# Patient Record
Sex: Female | Born: 1955
Health system: Southern US, Community
[De-identification: ages and names within clinical notes are randomized; demographics above are authoritative.]

## PROBLEM LIST (undated history)

## (undated) DIAGNOSIS — R55 Syncope and collapse: Secondary | ICD-10-CM

## (undated) DIAGNOSIS — N390 Urinary tract infection, site not specified: Secondary | ICD-10-CM

## (undated) DIAGNOSIS — T7840XA Allergy, unspecified, initial encounter: Secondary | ICD-10-CM

## (undated) DIAGNOSIS — Z86010 Personal history of colonic polyps: Secondary | ICD-10-CM

## (undated) DIAGNOSIS — J309 Allergic rhinitis, unspecified: Secondary | ICD-10-CM

## (undated) DIAGNOSIS — J302 Other seasonal allergic rhinitis: Secondary | ICD-10-CM

## (undated) DIAGNOSIS — M810 Age-related osteoporosis without current pathological fracture: Secondary | ICD-10-CM

## (undated) HISTORY — DX: Age-related osteoporosis without current pathological fracture: M81.0

## (undated) HISTORY — DX: Other seasonal allergic rhinitis: J30.2

## (undated) HISTORY — DX: Syncope and collapse: R55

## (undated) HISTORY — DX: Allergic rhinitis, unspecified: J30.9

## (undated) HISTORY — PX: COLONOSCOPY: SHX174

## (undated) HISTORY — PX: VARICOSE VEIN SURGERY: SHX832

## (undated) HISTORY — DX: Personal history of colonic polyps: Z86.010

## (undated) HISTORY — DX: Urinary tract infection, site not specified: N39.0

## (undated) HISTORY — DX: Allergy, unspecified, initial encounter: T78.40XA

## (undated) HISTORY — PX: TONSILLECTOMY: SUR1361

## (undated) HISTORY — PX: TMJ ARTHROPLASTY: SHX1066

---

## 2009-06-22 ENCOUNTER — Encounter: Payer: Self-pay | Admitting: Family Medicine

## 2009-10-03 ENCOUNTER — Ambulatory Visit: Payer: Self-pay | Admitting: Family Medicine

## 2009-10-03 DIAGNOSIS — J309 Allergic rhinitis, unspecified: Secondary | ICD-10-CM

## 2009-10-03 HISTORY — DX: Allergic rhinitis, unspecified: J30.9

## 2011-01-02 NOTE — Letter (Signed)
Summary: Records from [facility]   Records from [facility]   Imported By: Maryln Gottron 12/28/2009 12:19:32  _____________________________________________________________________  External Attachment:    Type:   Image     Comment:   External Document

## 2011-09-27 ENCOUNTER — Other Ambulatory Visit: Payer: Self-pay | Admitting: Family Medicine

## 2011-09-27 DIAGNOSIS — Z1231 Encounter for screening mammogram for malignant neoplasm of breast: Secondary | ICD-10-CM

## 2011-10-24 ENCOUNTER — Ambulatory Visit (HOSPITAL_COMMUNITY)
Admission: RE | Admit: 2011-10-24 | Discharge: 2011-10-24 | Disposition: A | Discharge status: Home / Self Care | Payer: 59 | Source: Ambulatory Visit | Attending: Family Medicine | Admitting: Family Medicine

## 2011-10-24 DIAGNOSIS — Z1231 Encounter for screening mammogram for malignant neoplasm of breast: Secondary | ICD-10-CM

## 2012-01-24 ENCOUNTER — Telehealth: Payer: Self-pay | Admitting: Family Medicine

## 2012-01-24 ENCOUNTER — Ambulatory Visit (INDEPENDENT_AMBULATORY_CARE_PROVIDER_SITE_OTHER): Payer: 59 | Admitting: Family Medicine

## 2012-01-24 ENCOUNTER — Encounter: Payer: Self-pay | Admitting: Family Medicine

## 2012-01-24 DIAGNOSIS — J309 Allergic rhinitis, unspecified: Secondary | ICD-10-CM

## 2012-01-24 DIAGNOSIS — J069 Acute upper respiratory infection, unspecified: Secondary | ICD-10-CM

## 2012-01-24 MED ORDER — FLUTICASONE PROPIONATE 50 MCG/ACT NA SUSP: 2.0000 | Freq: Every day | NASAL | Status: DC

## 2012-01-24 NOTE — Progress Notes (Signed)
  Subjective:    Patient ID: Rebecca Ray, female    DOB: 1956-04-04, 56 y.o.   MRN: [id number]  HPI  Patient has history of perennial allergic rhinitis. Takes Zyrtec daily and Flonase frequently. Needs refills of Flonase. She also has recent cold symptoms that started Monday. Increase malaise and bodyaches. Clear nasal discharge. Rare cough. Minimal sore throat.  No side effects from Flonase.   Review of Systems  Constitutional: Positive for fatigue. Negative for fever and chills.  HENT: Positive for congestion and postnasal drip.   Respiratory: Negative for cough.        Objective:   Physical Exam  Constitutional: She appears well-developed and well-nourished.  HENT:  Right Ear: External ear normal.  Left Ear: External ear normal.  Mouth/Throat: Oropharynx is clear and moist.  Neck: Neck supple.  Cardiovascular: Normal rate and regular rhythm.   Pulmonary/Chest: Effort normal and breath sounds normal. No respiratory distress. She has no wheezes. She has no rales.  Lymphadenopathy:    She has no cervical adenopathy.          Assessment & Plan:  #1 allergic rhinitis, perennial. Refill Flonase for one year #2 viral URI. Plenty of fluids. Flonase as above. Followup as needed

## 2012-01-24 NOTE — Telephone Encounter (Signed)
VM left for pt on home phone that we cannot fill this med for her with time lapse of 3 years.  Perhaps her OB/GYN can fill this for her, or she can schedule a re-establish appt.

## 2012-01-24 NOTE — Telephone Encounter (Signed)
Pt called req to get a refill of a script for Flonase nasal spray. Pt said that this med was prescribed to her by a Dr. In [place]. Pls call in to [facility] in [place]. Pls notify pt when this has been done. Pt last seen in 10/2009.

## 2014-06-16 ENCOUNTER — Other Ambulatory Visit (INDEPENDENT_AMBULATORY_CARE_PROVIDER_SITE_OTHER): Payer: 59

## 2014-06-16 DIAGNOSIS — Z0000 Encounter for general adult medical examination without abnormal findings: Secondary | ICD-10-CM

## 2014-06-16 LAB — LIPID PANEL
CHOL/HDL RATIO: 4
Cholesterol: 316 mg/dL — ABNORMAL HIGH (ref 0–200)
HDL: 86.7 mg/dL (ref >39.00)
LDL Cholesterol: 217 mg/dL — ABNORMAL HIGH (ref 0–99)
NONHDL: 229.3
TRIGLYCERIDES: 63 mg/dL (ref 0.0–149.0)
VLDL: 12.6 mg/dL (ref 0.0–40.0)

## 2014-06-16 LAB — POCT URINALYSIS DIPSTICK
BILIRUBIN UA: NEGATIVE
GLUCOSE UA: NEGATIVE
Ketones, UA: NEGATIVE
LEUKOCYTES UA: NEGATIVE
Nitrite, UA: NEGATIVE
Protein, UA: NEGATIVE
RBC UA: NEGATIVE
Spec Grav, UA: 1.015
UROBILINOGEN UA: 0.2
pH, UA: 7.5

## 2014-06-16 LAB — BASIC METABOLIC PANEL
BUN: 13 mg/dL (ref 6–23)
CALCIUM: 10.1 mg/dL (ref 8.4–10.5)
CO2: 30 mEq/L (ref 19–32)
CREATININE: 0.7 mg/dL (ref 0.4–1.2)
Chloride: 100 mEq/L (ref 96–112)
GFR: 99.57 mL/min (ref >60.00)
Glucose, Bld: 99 mg/dL (ref 70–99)
Potassium: 4.1 mEq/L (ref 3.5–5.1)
Sodium: 139 mEq/L (ref 135–145)

## 2014-06-16 LAB — CBC WITH DIFFERENTIAL/PLATELET
BASOS PCT: 0.4 % (ref 0.0–3.0)
Basophils Absolute: 0 10*3/uL (ref 0.0–0.1)
EOS ABS: 0 10*3/uL (ref 0.0–0.7)
EOS PCT: 1.2 % (ref 0.0–5.0)
HCT: 43.2 % (ref 36.0–46.0)
Hemoglobin: 14.4 g/dL (ref 12.0–15.0)
LYMPHS PCT: 46.3 % — AB (ref 12.0–46.0)
Lymphs Abs: 1.8 10*3/uL (ref 0.7–4.0)
MCHC: 33.5 g/dL (ref 30.0–36.0)
MCV: 95.6 fl (ref 78.0–100.0)
MONOS PCT: 9 % (ref 3.0–12.0)
Monocytes Absolute: 0.3 10*3/uL (ref 0.1–1.0)
NEUTROS PCT: 43.1 % (ref 43.0–77.0)
Neutro Abs: 1.7 10*3/uL (ref 1.4–7.7)
Platelets: 236 10*3/uL (ref 150.0–400.0)
RBC: 4.52 Mil/uL (ref 3.87–5.11)
RDW: 13.5 % (ref 11.5–15.5)
WBC: 3.9 10*3/uL — AB (ref 4.0–10.5)

## 2014-06-16 LAB — HEPATIC FUNCTION PANEL
ALK PHOS: 62 U/L (ref 39–117)
ALT: 16 U/L (ref 0–35)
AST: 20 U/L (ref 0–37)
Albumin: 4.6 g/dL (ref 3.5–5.2)
BILIRUBIN DIRECT: 0 mg/dL (ref 0.0–0.3)
TOTAL PROTEIN: 7.5 g/dL (ref 6.0–8.3)
Total Bilirubin: 0.6 mg/dL (ref 0.2–1.2)

## 2014-06-16 LAB — TSH: TSH: 1.84 u[IU]/mL (ref 0.35–4.50)

## 2014-06-25 ENCOUNTER — Encounter: Payer: Self-pay | Admitting: Family Medicine

## 2014-06-25 ENCOUNTER — Ambulatory Visit (INDEPENDENT_AMBULATORY_CARE_PROVIDER_SITE_OTHER): Payer: 59 | Admitting: Family Medicine

## 2014-06-25 VITALS — BP 110/72 | HR 85 | Temp 98.2°F | Wt 144.0 lb

## 2014-06-25 DIAGNOSIS — Z0000 Encounter for general adult medical examination without abnormal findings: Secondary | ICD-10-CM

## 2014-06-25 NOTE — Progress Notes (Signed)
Pre visit review using our clinic review tool, if applicable. No additional management support is needed unless otherwise documented below in the visit note. 

## 2014-06-25 NOTE — Progress Notes (Signed)
   Subjective:    Patient ID: Rebecca Ray, female    DOB: August 27, 1956, 58 y.o.   MRN: [id number]  HPI Patient seen for complete physical. She sees gynecologist regularly. She's never had screening colonoscopy. Tetanus up-to-date. She exercises regularly. No family history of premature CAD. She has never smoked.  Reviewed:  Past Medical History  Diagnosis Date  . ALLERGIC RHINITIS 10/03/2009  . UTI (urinary tract infection)    Past Surgical History  Procedure Laterality Date  . Tonsillectomy    . Varicose vein surgery    . Tmj arthroplasty      reports that she has never smoked. She does not have any smokeless tobacco history on file. Her alcohol and drug histories are not on file. family history includes Arthritis in her other; Hyperlipidemia in her other; Stroke in her other. No Known Allergies    Review of Systems  Constitutional: Negative for fever, activity change, appetite change, fatigue and unexpected weight change.  HENT: Negative for ear pain, hearing loss, sore throat and trouble swallowing.   Eyes: Negative for visual disturbance.  Respiratory: Negative for cough and shortness of breath.   Cardiovascular: Negative for chest pain and palpitations.  Gastrointestinal: Negative for abdominal pain, diarrhea, constipation and blood in stool.  Endocrine: Negative for polydipsia and polyuria.  Genitourinary: Negative for dysuria and hematuria.  Musculoskeletal: Negative for arthralgias, back pain and myalgias.  Skin: Negative for rash.  Neurological: Negative for dizziness, syncope and headaches.  Hematological: Negative for adenopathy.  Psychiatric/Behavioral: Negative for confusion and dysphoric mood.       Objective:   Physical Exam  Constitutional: She is oriented to person, place, and time. She appears well-developed and well-nourished.  HENT:  Head: Normocephalic and atraumatic.  Eyes: EOM are normal. Pupils are equal, round, and reactive to light.  Neck:  Normal range of motion. Neck supple. No thyromegaly present.  Cardiovascular: Normal rate, regular rhythm and normal heart sounds.   No murmur heard. Pulmonary/Chest: Breath sounds normal. No respiratory distress. She has no wheezes. She has no rales.  Abdominal: Soft. Bowel sounds are normal. She exhibits no distension and no mass. There is no tenderness. There is no rebound and no guarding.  Musculoskeletal: Normal range of motion. She exhibits no edema.  Lymphadenopathy:    She has no cervical adenopathy.  Neurological: She is alert and oriented to person, place, and time. She displays normal reflexes. No cranial nerve deficit.  Skin: No rash noted.  Psychiatric: She has a normal mood and affect. Her behavior is normal. Judgment and thought content normal.          Assessment & Plan:  Complete physical. Labs reviewed. She has high cholesterol but excellent HDL. Only 2% CHD risk over 10 years. She's not interested in statin therapy. Set up colonoscopy. Continue GYN followup

## 2014-06-30 ENCOUNTER — Encounter: Payer: Self-pay | Admitting: Internal Medicine

## 2014-08-25 ENCOUNTER — Ambulatory Visit (AMBULATORY_SURGERY_CENTER): Payer: Self-pay

## 2014-08-25 VITALS — Ht 67.0 in | Wt 141.4 lb

## 2014-08-25 DIAGNOSIS — Z1211 Encounter for screening for malignant neoplasm of colon: Secondary | ICD-10-CM

## 2014-08-25 NOTE — Progress Notes (Signed)
No allergie to eggs or soy No home oxygen No past problems with anesthesia-difficulty waking up and diaphoretic in [place] for varicose vein surgery No diet/weight loss meds  Has email  Emmi instructions given for colonoscopy

## 2014-09-02 ENCOUNTER — Encounter: Payer: Self-pay | Admitting: Internal Medicine

## 2014-09-07 ENCOUNTER — Ambulatory Visit (AMBULATORY_SURGERY_CENTER): Payer: 59 | Admitting: Internal Medicine

## 2014-09-07 ENCOUNTER — Encounter: Payer: Self-pay | Admitting: Internal Medicine

## 2014-09-07 VITALS — BP 115/71 | HR 75 | Temp 96.5°F | Resp 23 | Ht 67.0 in | Wt 141.0 lb

## 2014-09-07 DIAGNOSIS — Z860100 Personal history of colon polyps, unspecified: Secondary | ICD-10-CM

## 2014-09-07 DIAGNOSIS — D123 Benign neoplasm of transverse colon: Secondary | ICD-10-CM

## 2014-09-07 DIAGNOSIS — Z1211 Encounter for screening for malignant neoplasm of colon: Secondary | ICD-10-CM

## 2014-09-07 HISTORY — DX: Personal history of colonic polyps: Z86.010

## 2014-09-07 HISTORY — DX: Personal history of colon polyps, unspecified: Z86.0100

## 2014-09-07 MED ORDER — SODIUM CHLORIDE 0.9 % IV SOLN: 500.0000 mL | INTRAVENOUS | Status: DC

## 2014-09-07 NOTE — Op Note (Addendum)
[place]  [address] [place], [id number]   COLONOSCOPY PROCEDURE REPORT  PATIENT: Rebecca Ray, Rebecca Ray  MR#: [id number] BIRTHDATE: [birth year] , 73  yrs. old GENDER: female ENDOSCOPIST: Gatha Mayer, MD, [facility] PROCEDURE DATE:  09/07/2014 PROCEDURE:   Colonoscopy with snare polypectomy First Screening Colonoscopy - Avg.  risk and is 50 yrs.  old or older Yes.  Prior Negative Screening - Now for repeat screening. N/A  History of Adenoma - Now for follow-up colonoscopy & has been > or = to 3 yrs.  N/A ASA CLASS:   Class I INDICATIONS:first colonoscopy and average risk for colon cancer. MEDICATIONS: Propofol 400 mg IV and Monitored anesthesia care  DESCRIPTION OF PROCEDURE:   After the risks benefits and alternatives of the procedure were thoroughly explained, informed consent was obtained.  The digital rectal exam revealed no abnormalities of the rectum.   The LB [id number]  endoscope was introduced through the anus and advanced to the cecum, which was identified by both the appendix and ileocecal valve. No adverse events experienced.   The quality of the prep was excellent, using MiraLax  The instrument was then slowly withdrawn as the colon was fully examined.      COLON FINDINGS: A semi-pedunculated polyp measuring 12 mm in size was found in the transverse colon.  A polypectomy was performed using snare cautery.  The resection was complete, the polyp tissue was completely retrieved and sent to histology.   The examination was otherwise normal.   Right colon retroflexion included. Retroflexed views revealed no abnormalities. The time to cecum=6 minutes 42 seconds.  Withdrawal time=10 minutes 04 seconds.  The scope was withdrawn and the procedure completed. COMPLICATIONS: There were no immediate complications.  ENDOSCOPIC IMPRESSION: 1.   Semi-pedunculated polyp was found in the transverse colon; polypectomy was performed using snare cautery 2.   The  examination was otherwise normal - excellent prep - first screening  RECOMMENDATIONS: 1.  Timing of repeat colonoscopy will be determined by pathology findings. 2.  Hold Aspirin and all other NSAIDS for 2 weeks.  eSigned:  Gatha Mayer, MD, [facility] 09/07/2014 11:51 AM Revised: 09/07/2014 11:51 AM  cc: Carolann Littler, MD and The Patient

## 2014-09-07 NOTE — Progress Notes (Signed)
Called to room to assist during endoscopic procedure.  Patient ID and intended procedure confirmed with present staff. Received instructions for my participation in the procedure from the performing physician.  

## 2014-09-07 NOTE — Patient Instructions (Addendum)
I found and removed one polyp that looks benign.  I will let you know pathology results and when to have another routine colonoscopy by mail.  I appreciate the opportunity to care for you. Gatha Mayer, MD, FACG  YOU HAD AN ENDOSCOPIC PROCEDURE TODAY AT [facility]: Refer to the procedure report that was given to you for any specific questions about what was found during the examination.  If the procedure report does not answer your questions, please call your gastroenterologist to clarify.  If you requested that your care partner not be given the details of your procedure findings, then the procedure report has been included in a sealed envelope for you to review at your convenience later.  YOU SHOULD EXPECT: Some feelings of bloating in the abdomen. Passage of more gas than usual.  Walking can help get rid of the air that was put into your GI tract during the procedure and reduce the bloating. If you had a lower endoscopy (such as a colonoscopy or flexible sigmoidoscopy) you may notice spotting of blood in your stool or on the toilet paper. If you underwent a bowel prep for your procedure, then you may not have a normal bowel movement for a few days.  DIET: Your first meal following the procedure should be a light meal and then it is ok to progress to your normal diet.  A half-sandwich or bowl of soup is an example of a good first meal.  Heavy or fried foods are harder to digest and may make you feel nauseous or bloated.  Likewise meals heavy in dairy and vegetables can cause extra gas to form and this can also increase the bloating.  Drink plenty of fluids but you should avoid alcoholic beverages for 24 hours.  ACTIVITY: Your care partner should take you home directly after the procedure.  You should plan to take it easy, moving slowly for the rest of the day.  You can resume normal activity the day after the procedure however you should NOT DRIVE or use heavy machinery  for 24 hours (because of the sedation medicines used during the test).    SYMPTOMS TO REPORT IMMEDIATELY: A gastroenterologist can be reached at any hour.  During normal business hours, 8:30 AM to 5:00 PM Monday through Friday, call [phone].  After hours and on weekends, please call the GI answering service at [phone] who will take a message and have the physician on call contact you.   Following lower endoscopy (colonoscopy or flexible sigmoidoscopy):  Excessive amounts of blood in the stool  Significant tenderness or worsening of abdominal pains  Swelling of the abdomen that is new, acute  Fever of 100F or higher  FOLLOW UP: If any biopsies were taken you will be contacted by phone or by letter within the next 1-3 weeks.  Call your gastroenterologist if you have not heard about the biopsies in 3 weeks.  Our staff will call the home number listed on your records the next business day following your procedure to check on you and address any questions or concerns that you may have at that time regarding the information given to you following your procedure. This is a courtesy call and so if there is no answer at the home number and we have not heard from you through the emergency physician on call, we will assume that you have returned to your regular daily activities without incident.  SIGNATURES/CONFIDENTIALITY: You and/or your care partner have  signed paperwork which will be entered into your electronic medical record.  These signatures attest to the fact that that the information above on your After Visit Summary has been reviewed and is understood.  Full responsibility of the confidentiality of this discharge information lies with you and/or your care-partner.  Recommendations Next  colonoscopy determined by pathology results. Polyp handout given to patient/caregiver. Advised patient to hold all aspirin, aspirin products, and NSAIDS for 2 weeks.

## 2014-09-07 NOTE — Progress Notes (Signed)
Procedure ends, to recovery, report given and VSS. 

## 2014-09-08 ENCOUNTER — Telehealth: Payer: Self-pay

## 2014-09-08 NOTE — Telephone Encounter (Signed)
  Follow up Call-  Call back number 09/07/2014  Post procedure Call Back phone  # [phone]  Permission to leave phone message Yes     Patient questions:  Do you have a fever, pain , or abdominal swelling? No. Pain Score  0 *  Have you tolerated food without any problems? Yes.    Have you been able to return to your normal activities? Yes.    Do you have any questions about your discharge instructions: Diet   No. Medications  No. Follow up visit  No.  Do you have questions or concerns about your Care? No.  Actions: * If pain score is 4 or above: No action needed, pain <4.  No problems per the pt. maw

## 2014-09-15 ENCOUNTER — Encounter: Payer: Self-pay | Admitting: Internal Medicine

## 2014-09-15 NOTE — Progress Notes (Signed)
Quick Note:  12 mmadenoma - repeat colonoscopy 2018 ______

## 2015-04-05 ENCOUNTER — Other Ambulatory Visit: Payer: Self-pay | Admitting: Obstetrics and Gynecology

## 2015-04-07 ENCOUNTER — Other Ambulatory Visit: Payer: Self-pay | Admitting: Obstetrics and Gynecology

## 2015-04-07 DIAGNOSIS — R928 Other abnormal and inconclusive findings on diagnostic imaging of breast: Secondary | ICD-10-CM

## 2015-04-07 LAB — CYTOLOGY - PAP

## 2015-04-13 ENCOUNTER — Ambulatory Visit
Admission: RE | Admit: 2015-04-13 | Discharge: 2015-04-13 | Disposition: A | Discharge status: Home / Self Care | Payer: 59 | Source: Ambulatory Visit | Attending: Obstetrics and Gynecology | Admitting: Obstetrics and Gynecology

## 2015-04-13 DIAGNOSIS — R928 Other abnormal and inconclusive findings on diagnostic imaging of breast: Secondary | ICD-10-CM

## 2015-04-13 LAB — HM MAMMOGRAPHY

## 2015-05-10 ENCOUNTER — Telehealth: Payer: Self-pay

## 2015-05-10 NOTE — Telephone Encounter (Signed)
Patient returned your call and states she had a mammogram last month at the [facility] center.  The notes are in [place].

## 2015-05-10 NOTE — Telephone Encounter (Signed)
Left mess, home voicemail to call us (concerning overdue mammogram)

## 2015-06-29 ENCOUNTER — Encounter: Payer: Self-pay | Admitting: *Deleted

## 2016-05-30 ENCOUNTER — Other Ambulatory Visit: Payer: Self-pay | Admitting: Obstetrics and Gynecology

## 2016-05-31 LAB — CYTOLOGY - PAP

## 2016-08-08 ENCOUNTER — Other Ambulatory Visit (INDEPENDENT_AMBULATORY_CARE_PROVIDER_SITE_OTHER): Payer: 59

## 2016-08-08 DIAGNOSIS — Z0000 Encounter for general adult medical examination without abnormal findings: Secondary | ICD-10-CM

## 2016-08-08 LAB — CBC WITH DIFFERENTIAL/PLATELET
BASOS PCT: 0.6 % (ref 0.0–3.0)
Basophils Absolute: 0 10*3/uL (ref 0.0–0.1)
EOS PCT: 1 % (ref 0.0–5.0)
Eosinophils Absolute: 0 10*3/uL (ref 0.0–0.7)
HCT: 42 % (ref 36.0–46.0)
Hemoglobin: 14.6 g/dL (ref 12.0–15.0)
LYMPHS ABS: 1.5 10*3/uL (ref 0.7–4.0)
Lymphocytes Relative: 40.5 % (ref 12.0–46.0)
MCHC: 34.7 g/dL (ref 30.0–36.0)
MCV: 92.6 fl (ref 78.0–100.0)
MONOS PCT: 11.3 % (ref 3.0–12.0)
Monocytes Absolute: 0.4 10*3/uL (ref 0.1–1.0)
NEUTROS ABS: 1.8 10*3/uL (ref 1.4–7.7)
NEUTROS PCT: 46.6 % (ref 43.0–77.0)
PLATELETS: 256 10*3/uL (ref 150.0–400.0)
RBC: 4.53 Mil/uL (ref 3.87–5.11)
RDW: 13.6 % (ref 11.5–15.5)
WBC: 3.8 10*3/uL — ABNORMAL LOW (ref 4.0–10.5)

## 2016-08-08 LAB — LIPID PANEL
CHOLESTEROL: 295 mg/dL — AB (ref 0–200)
HDL: 85.4 mg/dL (ref >39.00)
LDL Cholesterol: 192 mg/dL — ABNORMAL HIGH (ref 0–99)
NonHDL: 210.02
Total CHOL/HDL Ratio: 3
Triglycerides: 90 mg/dL (ref 0.0–149.0)
VLDL: 18 mg/dL (ref 0.0–40.0)

## 2016-08-08 LAB — TSH: TSH: 3.7 u[IU]/mL (ref 0.35–4.50)

## 2016-08-08 LAB — BASIC METABOLIC PANEL
BUN: 10 mg/dL (ref 6–23)
CALCIUM: 9.7 mg/dL (ref 8.4–10.5)
CO2: 32 meq/L (ref 19–32)
Chloride: 101 mEq/L (ref 96–112)
Creatinine, Ser: 0.64 mg/dL (ref 0.40–1.20)
GFR: 100.62 mL/min (ref >60.00)
GLUCOSE: 91 mg/dL (ref 70–99)
POTASSIUM: 4.4 meq/L (ref 3.5–5.1)
SODIUM: 140 meq/L (ref 135–145)

## 2016-08-08 LAB — HEPATIC FUNCTION PANEL
ALBUMIN: 4.6 g/dL (ref 3.5–5.2)
ALT: 16 U/L (ref 0–35)
AST: 19 U/L (ref 0–37)
Alkaline Phosphatase: 65 U/L (ref 39–117)
Bilirubin, Direct: 0.1 mg/dL (ref 0.0–0.3)
Total Bilirubin: 0.6 mg/dL (ref 0.2–1.2)
Total Protein: 6.9 g/dL (ref 6.0–8.3)

## 2016-08-13 ENCOUNTER — Encounter: Payer: Self-pay | Admitting: Family Medicine

## 2016-08-13 ENCOUNTER — Ambulatory Visit (INDEPENDENT_AMBULATORY_CARE_PROVIDER_SITE_OTHER): Payer: 59 | Admitting: Family Medicine

## 2016-08-13 VITALS — BP 110/80 | HR 96 | Temp 98.3°F | Ht 67.0 in | Wt 145.6 lb

## 2016-08-13 DIAGNOSIS — Z0000 Encounter for general adult medical examination without abnormal findings: Secondary | ICD-10-CM | POA: Diagnosis not present

## 2016-08-13 NOTE — Patient Instructions (Signed)

## 2016-08-13 NOTE — Progress Notes (Signed)
Pre visit review using our clinic review tool, if applicable. No additional management support is needed unless otherwise documented below in the visit note. 

## 2016-08-13 NOTE — Progress Notes (Signed)
Subjective:     Patient ID: Rebecca Ray, female   DOB: Nov 19, 1956, 60 y.o.   MRN: [id number]  HPI Patient here for physical exam. She sees gynecologist yearly and is getting regular mammograms and Pap smears through them. She has no chronic medical problems. Takes no regular medications. She does have prior history of colon polyps and will be scheduled for repeat colonoscopy next year. Tetanus up-to-date. She declines flu vaccination. Exercises regularly. Family history significant for mother and sister with history of fatty liver changes. Patient is nonsmoker. Drinks about 1 alcoholic beverage every other day  Past Medical History:  Diagnosis Date  . ALLERGIC RHINITIS 10/03/2009  . History of colonic polyp - adenoma 09/07/2014  . Seasonal allergies   . UTI (urinary tract infection)    Past Surgical History:  Procedure Laterality Date  . TMJ ARTHROPLASTY    . TONSILLECTOMY    . VARICOSE VEIN SURGERY      reports that she has never smoked. She has never used smokeless tobacco. She reports that she drinks about 1.8 oz of alcohol per week . She reports that she does not use drugs. family history includes Arthritis in her other; Atrial fibrillation in her father; Cirrhosis in her mother; Hyperlipidemia in her other; Stroke in her other. No Known Allergies   Review of Systems  Constitutional: Negative for activity change, appetite change, fatigue, fever and unexpected weight change.  HENT: Negative for ear pain, hearing loss, sore throat and trouble swallowing.   Eyes: Negative for visual disturbance.  Respiratory: Negative for cough and shortness of breath.   Cardiovascular: Negative for chest pain and palpitations.  Gastrointestinal: Negative for abdominal pain, blood in stool, constipation and diarrhea.  Endocrine: Negative for polydipsia and polyuria.  Genitourinary: Negative for dysuria and hematuria.  Musculoskeletal: Negative for arthralgias, back pain and myalgias.  Skin: Negative  for rash.  Neurological: Negative for dizziness, syncope, weakness and headaches.  Hematological: Negative for adenopathy. Does not bruise/bleed easily.  Psychiatric/Behavioral: Negative for confusion and dysphoric mood.       Objective:   Physical Exam  Constitutional: She is oriented to person, place, and time. She appears well-developed and well-nourished.  HENT:  Head: Normocephalic and atraumatic.  Eyes: EOM are normal. Pupils are equal, round, and reactive to light.  Neck: Normal range of motion. Neck supple. No thyromegaly present.  Cardiovascular: Normal rate, regular rhythm and normal heart sounds.   No murmur heard. Pulmonary/Chest: Breath sounds normal. No respiratory distress. She has no wheezes. She has no rales.  Abdominal: Soft. Bowel sounds are normal. She exhibits no distension and no mass. There is no tenderness. There is no rebound and no guarding.  Genitourinary:  Genitourinary Comments: Per GYN  Musculoskeletal: Normal range of motion. She exhibits no edema.  Lymphadenopathy:    She has no cervical adenopathy.  Neurological: She is alert and oriented to person, place, and time. She displays normal reflexes. No cranial nerve deficit.  Skin: No rash noted.  Psychiatric: She has a normal mood and affect. Her behavior is normal. Judgment and thought content normal.       Assessment:     Physical exam-patient declines flu vaccination. Labs reviewed. She has significant dyslipidemia but overall low risk for CAD    Plan:     -Flu vaccine offered and declined -Will need repeat colonoscopy by next year -We discussed prevention of fatty liver disease with continued weight control -Discussed dyslipidemia. She is not interested in statin therapy. We discussed lifestyle  management  Eulas Post MD [facility]

## 2017-06-07 LAB — HM MAMMOGRAPHY

## 2017-06-12 ENCOUNTER — Encounter: Payer: Self-pay | Admitting: Family Medicine

## 2017-09-25 ENCOUNTER — Encounter: Payer: Self-pay | Admitting: Internal Medicine

## 2017-10-01 ENCOUNTER — Ambulatory Visit (AMBULATORY_SURGERY_CENTER): Payer: Self-pay

## 2017-10-01 VITALS — Ht 67.0 in | Wt 144.0 lb

## 2017-10-01 DIAGNOSIS — Z86010 Personal history of colonic polyps: Secondary | ICD-10-CM

## 2017-10-01 NOTE — Progress Notes (Signed)
Per pt, no allergies to soy or egg products.Pt not taking any weight loss meds or using  O2 at home.   Pt refused Emmi video. 

## 2017-10-02 ENCOUNTER — Encounter: Payer: Self-pay | Admitting: Internal Medicine

## 2017-10-16 ENCOUNTER — Ambulatory Visit (AMBULATORY_SURGERY_CENTER): Payer: 59 | Admitting: Internal Medicine

## 2017-10-16 ENCOUNTER — Other Ambulatory Visit: Payer: Self-pay

## 2017-10-16 ENCOUNTER — Encounter: Payer: Self-pay | Admitting: Internal Medicine

## 2017-10-16 DIAGNOSIS — Z86010 Personal history of colonic polyps: Secondary | ICD-10-CM | POA: Diagnosis not present

## 2017-10-16 NOTE — Progress Notes (Signed)
A/ox3 pleased with MAC, report to [place]

## 2017-10-16 NOTE — Op Note (Signed)
[place] Patient Name: Rebecca Ray Procedure Date: 10/16/2017 3:10 PM MRN: [id number] Endoscopist: Gatha Mayer , MD Age: 61 Referring MD:  Date of Birth: November 27, 1956 Gender: Female Account #: [id number] Procedure:                Colonoscopy Indications:              High risk colon cancer surveillance: Personal                            history of colonic polyps Medicines:                Propofol per Anesthesia, Monitored Anesthesia Care Procedure:                Pre-Anesthesia Assessment:                           - Prior to the procedure, a History and Physical                            was performed, and patient medications and                            allergies were reviewed. The patient's tolerance of                            previous anesthesia was also reviewed. The risks                            and benefits of the procedure and the sedation                            options and risks were discussed with the patient.                            All questions were answered, and informed consent                            was obtained. Prior Anticoagulants: The patient has                            taken no previous anticoagulant or antiplatelet                            agents. ASA Grade Assessment: II - A patient with                            mild systemic disease. After reviewing the risks                            and benefits, the patient was deemed in                            satisfactory condition to undergo the procedure.  After obtaining informed consent, the colonoscope                            was passed under direct vision. Throughout the                            procedure, the patient's blood pressure, pulse, and                            oxygen saturations were monitored continuously. The                            Colonoscope was introduced through the anus and                            advanced to  the the cecum, identified by                            appendiceal orifice and ileocecal valve. The                            ileocecal valve, appendiceal orifice, and rectum                            were photographed. The quality of the bowel                            preparation was good. The bowel preparation used                            was Miralax. Scope In: 3:25:32 PM Scope Out: 3:48:19 PM Scope Withdrawal Time: 0 hours 12 minutes 40 seconds  Total Procedure Duration: 0 hours 22 minutes 47 seconds  Findings:                 The perianal and digital rectal examinations were                            normal.                           Scattered diverticula were found in the entire                            colon.                           The exam was otherwise without abnormality on                            direct and retroflexion views. Complications:            No immediate complications. Estimated blood loss:                            None. Estimated Blood Loss:     Estimated blood loss:  none. Impression:               - Diverticulosis scattered in the entire examined                            colon.                           - The examination was otherwise normal on direct                            and retroflexion views.                           - No specimens collected.                           - Personal history of colonic polyp - 12 mm adenoma                            2015. Recommendation:           - Repeat colonoscopy in 5 years for surveillance.                           - Resume previous diet.                           - Continue present medications. Gatha Mayer, MD 10/16/2017 3:53:17 PM This report has been signed electronically.

## 2017-10-16 NOTE — Patient Instructions (Addendum)
No polyps today so your next routine colonoscopy should be in 5 years - 2023.  I appreciate the opportunity to care for you. Gatha Mayer, MD, FACG  YOU HAD AN ENDOSCOPIC PROCEDURE TODAY AT [facility]:   Refer to the procedure report that was given to you for any specific questions about what was found during the examination.  If the procedure report does not answer your questions, please call your gastroenterologist to clarify.  If you requested that your care partner not be given the details of your procedure findings, then the procedure report has been included in a sealed envelope for you to review at your convenience later.  YOU SHOULD EXPECT: Some feelings of bloating in the abdomen. Passage of more gas than usual.  Walking can help get rid of the air that was put into your GI tract during the procedure and reduce the bloating. If you had a lower endoscopy (such as a colonoscopy or flexible sigmoidoscopy) you may notice spotting of blood in your stool or on the toilet paper. If you underwent a bowel prep for your procedure, you may not have a normal bowel movement for a few days.  Please Note:  You might notice some irritation and congestion in your nose or some drainage.  This is from the oxygen used during your procedure.  There is no need for concern and it should clear up in a day or so.  SYMPTOMS TO REPORT IMMEDIATELY:   Following lower endoscopy (colonoscopy or flexible sigmoidoscopy):  Excessive amounts of blood in the stool  Significant tenderness or worsening of abdominal pains  Swelling of the abdomen that is new, acute  Fever of 100F or higher  For urgent or emergent issues, a gastroenterologist can be reached at any hour by calling [phone].   DIET:  We do recommend a small meal at first, but then you may proceed to your regular diet.  Drink plenty of fluids but you should avoid alcoholic beverages for 24 hours.  MEDICATIONS: Continue  present medications.  Please see handouts given to you by your recovery nurse.  ACTIVITY:  You should plan to take it easy for the rest of today and you should NOT DRIVE or use heavy machinery until tomorrow (because of the sedation medicines used during the test).    FOLLOW UP: Our staff will call the number listed on your records the next business day following your procedure to check on you and address any questions or concerns that you may have regarding the information given to you following your procedure. If we do not reach you, we will leave a message.  However, if you are feeling well and you are not experiencing any problems, there is no need to return our call.  We will assume that you have returned to your regular daily activities without incident.  If any biopsies were taken you will be contacted by phone or by letter within the next 1-3 weeks.  Please call us at [phone] if you have not heard about the biopsies in 3 weeks.   Thank you for allowing [place] to provide for your healthcare needs today.  SIGNATURES/CONFIDENTIALITY: You and/or your care partner have signed paperwork which will be entered into your electronic medical record.  These signatures attest to the fact that that the information above on your After Visit Summary has been reviewed and is understood.  Full responsibility of the confidentiality of this discharge information lies with you and/or  your care-partner. 

## 2017-10-17 ENCOUNTER — Telehealth: Payer: Self-pay | Admitting: *Deleted

## 2017-10-17 NOTE — Telephone Encounter (Signed)
  Follow up Call-  Call back number 10/16/2017  Post procedure Call Back phone  # [phone]  Permission to leave phone message Yes  Some recent data might be hidden     Patient questions:  Do you have a fever, pain , or abdominal swelling? No. Pain Score  0 *  Have you tolerated food without any problems? Yes.    Have you been able to return to your normal activities? Yes.    Do you have any questions about your discharge instructions: Diet   No. Medications  No. Follow up visit  No.  Do you have questions or concerns about your Care? No.  Actions: * If pain score is 4 or above: No action needed, pain <4.

## 2018-07-17 DIAGNOSIS — Z124 Encounter for screening for malignant neoplasm of cervix: Secondary | ICD-10-CM | POA: Diagnosis not present

## 2018-07-17 DIAGNOSIS — Z6822 Body mass index (BMI) 22.0-22.9, adult: Secondary | ICD-10-CM | POA: Diagnosis not present

## 2018-07-17 DIAGNOSIS — Z1231 Encounter for screening mammogram for malignant neoplasm of breast: Secondary | ICD-10-CM | POA: Diagnosis not present

## 2018-07-17 DIAGNOSIS — Z01419 Encounter for gynecological examination (general) (routine) without abnormal findings: Secondary | ICD-10-CM | POA: Diagnosis not present

## 2018-07-18 ENCOUNTER — Other Ambulatory Visit: Payer: Self-pay | Admitting: Obstetrics and Gynecology

## 2018-07-18 DIAGNOSIS — R928 Other abnormal and inconclusive findings on diagnostic imaging of breast: Secondary | ICD-10-CM

## 2018-07-24 ENCOUNTER — Ambulatory Visit
Admission: RE | Admit: 2018-07-24 | Discharge: 2018-07-24 | Disposition: A | Discharge status: Home / Self Care | Payer: BLUE CROSS/BLUE SHIELD | Source: Ambulatory Visit | Attending: Obstetrics and Gynecology | Admitting: Obstetrics and Gynecology

## 2018-07-24 ENCOUNTER — Ambulatory Visit: Payer: 59

## 2018-07-24 DIAGNOSIS — R922 Inconclusive mammogram: Secondary | ICD-10-CM | POA: Diagnosis not present

## 2018-07-24 DIAGNOSIS — R928 Other abnormal and inconclusive findings on diagnostic imaging of breast: Secondary | ICD-10-CM

## 2019-08-06 ENCOUNTER — Telehealth: Payer: Self-pay

## 2019-08-06 NOTE — Telephone Encounter (Signed)
lvm for pt to call back to set up for an appt

## 2019-08-17 DIAGNOSIS — Z01419 Encounter for gynecological examination (general) (routine) without abnormal findings: Secondary | ICD-10-CM | POA: Diagnosis not present

## 2019-08-17 DIAGNOSIS — Z6820 Body mass index (BMI) 20.0-20.9, adult: Secondary | ICD-10-CM | POA: Diagnosis not present

## 2019-08-17 DIAGNOSIS — Z124 Encounter for screening for malignant neoplasm of cervix: Secondary | ICD-10-CM | POA: Diagnosis not present

## 2019-08-27 DIAGNOSIS — D2262 Melanocytic nevi of left upper limb, including shoulder: Secondary | ICD-10-CM | POA: Diagnosis not present

## 2019-08-27 DIAGNOSIS — D485 Neoplasm of uncertain behavior of skin: Secondary | ICD-10-CM | POA: Diagnosis not present

## 2019-08-27 DIAGNOSIS — L570 Actinic keratosis: Secondary | ICD-10-CM | POA: Diagnosis not present

## 2019-08-27 DIAGNOSIS — L821 Other seborrheic keratosis: Secondary | ICD-10-CM | POA: Diagnosis not present

## 2019-08-27 DIAGNOSIS — L813 Cafe au lait spots: Secondary | ICD-10-CM | POA: Diagnosis not present

## 2019-09-02 DIAGNOSIS — Z1231 Encounter for screening mammogram for malignant neoplasm of breast: Secondary | ICD-10-CM | POA: Diagnosis not present

## 2019-09-09 ENCOUNTER — Other Ambulatory Visit: Payer: Self-pay

## 2019-09-09 ENCOUNTER — Encounter: Payer: Self-pay | Admitting: Family Medicine

## 2019-09-09 ENCOUNTER — Ambulatory Visit (INDEPENDENT_AMBULATORY_CARE_PROVIDER_SITE_OTHER): Payer: BC Managed Care – PPO | Admitting: Family Medicine

## 2019-09-09 VITALS — BP 122/62 | HR 95 | Temp 97.4°F | Wt 128.8 lb

## 2019-09-09 DIAGNOSIS — Z23 Encounter for immunization: Secondary | ICD-10-CM

## 2019-09-09 DIAGNOSIS — Z1159 Encounter for screening for other viral diseases: Secondary | ICD-10-CM | POA: Diagnosis not present

## 2019-09-09 DIAGNOSIS — E785 Hyperlipidemia, unspecified: Secondary | ICD-10-CM | POA: Diagnosis not present

## 2019-09-09 DIAGNOSIS — Z0000 Encounter for general adult medical examination without abnormal findings: Secondary | ICD-10-CM | POA: Diagnosis not present

## 2019-09-09 LAB — CBC WITH DIFFERENTIAL/PLATELET
Basophils Absolute: 0 10*3/uL (ref 0.0–0.1)
Basophils Relative: 0.5 % (ref 0.0–3.0)
Eosinophils Absolute: 0 10*3/uL (ref 0.0–0.7)
Eosinophils Relative: 0.9 % (ref 0.0–5.0)
HCT: 42 % (ref 36.0–46.0)
Hemoglobin: 14.2 g/dL (ref 12.0–15.0)
Lymphocytes Relative: 39.9 % (ref 12.0–46.0)
Lymphs Abs: 1.4 10*3/uL (ref 0.7–4.0)
MCHC: 33.7 g/dL (ref 30.0–36.0)
MCV: 96.6 fl (ref 78.0–100.0)
Monocytes Absolute: 0.4 10*3/uL (ref 0.1–1.0)
Monocytes Relative: 12.3 % — ABNORMAL HIGH (ref 3.0–12.0)
Neutro Abs: 1.6 10*3/uL (ref 1.4–7.7)
Neutrophils Relative %: 46.4 % (ref 43.0–77.0)
Platelets: 252 10*3/uL (ref 150.0–400.0)
RBC: 4.35 Mil/uL (ref 3.87–5.11)
RDW: 13.6 % (ref 11.5–15.5)
WBC: 3.4 10*3/uL — ABNORMAL LOW (ref 4.0–10.5)

## 2019-09-09 LAB — BASIC METABOLIC PANEL
BUN: 15 mg/dL (ref 6–23)
CO2: 32 mEq/L (ref 19–32)
Calcium: 10.3 mg/dL (ref 8.4–10.5)
Chloride: 99 mEq/L (ref 96–112)
Creatinine, Ser: 0.67 mg/dL (ref 0.40–1.20)
GFR: 88.89 mL/min (ref >60.00)
Glucose, Bld: 94 mg/dL (ref 70–99)
Potassium: 4.6 mEq/L (ref 3.5–5.1)
Sodium: 140 mEq/L (ref 135–145)

## 2019-09-09 LAB — HEPATIC FUNCTION PANEL
ALT: 16 U/L (ref 0–35)
AST: 19 U/L (ref 0–37)
Albumin: 4.8 g/dL (ref 3.5–5.2)
Alkaline Phosphatase: 60 U/L (ref 39–117)
Bilirubin, Direct: 0.1 mg/dL (ref 0.0–0.3)
Total Bilirubin: 0.5 mg/dL (ref 0.2–1.2)
Total Protein: 7 g/dL (ref 6.0–8.3)

## 2019-09-09 LAB — LIPID PANEL
Cholesterol: 276 mg/dL — ABNORMAL HIGH (ref 0–200)
HDL: 92.1 mg/dL (ref >39.00)
LDL Cholesterol: 170 mg/dL — ABNORMAL HIGH (ref 0–99)
NonHDL: 184.05
Total CHOL/HDL Ratio: 3
Triglycerides: 68 mg/dL (ref 0.0–149.0)
VLDL: 13.6 mg/dL (ref 0.0–40.0)

## 2019-09-09 LAB — TSH: TSH: 4.68 u[IU]/mL — ABNORMAL HIGH (ref 0.35–4.50)

## 2019-09-09 NOTE — Addendum Note (Signed)
Addended by: Suzette Battiest on: 09/09/2019 08:48 AM   Modules accepted: Orders

## 2019-09-09 NOTE — Progress Notes (Signed)
Subjective:     Patient ID: Rebecca Ray, female   DOB: 03/18/1956, 63 y.o.   MRN: [id number]  HPI   Rebecca Ray is seen for physical exam.  She sees gynecologist yearly and they have done Pap smear and mammogram recently.  She has history of hyperlipidemia but prior intolerance with statin.  She thinks that was Lipitor.  She states her father has "angina ".  She is not aware that he has had any kind of cardiac procedures or MI.  She takes no regular medications.  No history of shingles vaccine.  Due for tetanus.  Declines flu vaccine.  Due for repeat colonoscopy 2023  Past Medical History:  Diagnosis Date  . ALLERGIC RHINITIS 10/03/2009  . History of colonic polyp - adenoma 09/07/2014  . Seasonal allergies   . UTI (urinary tract infection)    Past Surgical History:  Procedure Laterality Date  . COLONOSCOPY    . TMJ ARTHROPLASTY    . TONSILLECTOMY    . VARICOSE VEIN SURGERY      reports that she has never smoked. She has never used smokeless tobacco. She reports current alcohol use of about 3.0 standard drinks of alcohol per week. She reports that she does not use drugs. family history includes Arthritis in an other family member; Atrial fibrillation in her father; Cirrhosis in her mother; Hyperlipidemia in an other family member; Stroke in an other family member. No Known Allergies   Review of Systems  Constitutional: Negative for activity change, appetite change, fatigue, fever and unexpected weight change.  HENT: Negative for ear pain, hearing loss, sore throat and trouble swallowing.   Eyes: Negative for visual disturbance.  Respiratory: Negative for cough and shortness of breath.   Cardiovascular: Negative for chest pain and palpitations.  Gastrointestinal: Negative for abdominal pain, blood in stool, constipation and diarrhea.  Endocrine: Negative for polydipsia and polyuria.  Genitourinary: Negative for dysuria and hematuria.  Musculoskeletal: Negative for arthralgias, back  pain and myalgias.  Skin: Negative for rash.  Neurological: Negative for dizziness, syncope and headaches.  Hematological: Negative for adenopathy.  Psychiatric/Behavioral: Negative for confusion and dysphoric mood.       Objective:   Physical Exam Constitutional:      Appearance: She is well-developed.  HENT:     Head: Normocephalic and atraumatic.  Eyes:     Pupils: Pupils are equal, round, and reactive to light.  Neck:     Musculoskeletal: Normal range of motion and neck supple.     Thyroid: No thyromegaly.  Cardiovascular:     Rate and Rhythm: Normal rate and regular rhythm.     Heart sounds: Normal heart sounds. No murmur.  Pulmonary:     Effort: No respiratory distress.     Breath sounds: Normal breath sounds. No wheezing or rales.  Abdominal:     General: Bowel sounds are normal. There is no distension.     Palpations: Abdomen is soft. There is no mass.     Tenderness: There is no abdominal tenderness. There is no guarding or rebound.  Genitourinary:    Comments: Per GYN Musculoskeletal: Normal range of motion.  Lymphadenopathy:     Cervical: No cervical adenopathy.  Skin:    Findings: No rash.  Neurological:     Mental Status: She is alert and oriented to person, place, and time.     Cranial Nerves: No cranial nerve deficit.     Deep Tendon Reflexes: Reflexes normal.  Psychiatric:  Behavior: Behavior normal.        Thought Content: Thought content normal.        Judgment: Judgment normal.        Assessment:     Physical exam.  We discussed the following health maintenance issues    Plan:     -Tdap given -Recommend flu vaccine and she declines -Check on insurance coverage for shingles vaccine -Screening labs obtained -Recommend DEXA scan by age 89 and she will discuss this with her gynecologist. -Continue regular weightbearing exercise and calcium and vitamin D  Eulas Post MD [facility] at [facility]

## 2019-09-09 NOTE — Patient Instructions (Signed)
Consider shingles vaccine (Shingrix) and check on insurance coverage if interested.   

## 2019-09-10 LAB — HEPATITIS C ANTIBODY
Hepatitis C Ab: NONREACTIVE
SIGNAL TO CUT-OFF: 0.01 (ref <1.00)

## 2020-02-09 ENCOUNTER — Telehealth: Payer: Self-pay | Admitting: Family Medicine

## 2020-02-09 ENCOUNTER — Other Ambulatory Visit: Payer: Self-pay

## 2020-02-09 DIAGNOSIS — R7989 Other specified abnormal findings of blood chemistry: Secondary | ICD-10-CM

## 2020-02-09 NOTE — Telephone Encounter (Signed)
Called patient and let her know that I have ordered the labs and scheduled her for a lab appointment for tomorrow at 9:40am. Patient verbalized an understanding.

## 2020-02-09 NOTE — Telephone Encounter (Signed)
Pt forgot to make an appt for labs back in October 2020 for her cpe. Pt was trying to call and set that up but informed her those lab orders have already expired and the pcp will have to send in new orders.    Pt can be reached at [phone]

## 2020-02-10 ENCOUNTER — Other Ambulatory Visit: Payer: Self-pay

## 2020-02-10 ENCOUNTER — Other Ambulatory Visit (INDEPENDENT_AMBULATORY_CARE_PROVIDER_SITE_OTHER): Payer: BC Managed Care – PPO

## 2020-02-10 DIAGNOSIS — R7989 Other specified abnormal findings of blood chemistry: Secondary | ICD-10-CM

## 2020-02-10 LAB — TSH: TSH: 4.49 u[IU]/mL (ref 0.35–4.50)

## 2020-02-10 LAB — T4, FREE: Free T4: 1.29 ng/dL (ref 0.60–1.60)

## 2020-03-10 ENCOUNTER — Other Ambulatory Visit: Payer: Self-pay

## 2020-03-11 ENCOUNTER — Ambulatory Visit (INDEPENDENT_AMBULATORY_CARE_PROVIDER_SITE_OTHER): Payer: BC Managed Care – PPO | Admitting: Family Medicine

## 2020-03-11 ENCOUNTER — Encounter: Payer: Self-pay | Admitting: Family Medicine

## 2020-03-11 VITALS — BP 116/66 | HR 92 | Temp 98.2°F | Wt 130.9 lb

## 2020-03-11 DIAGNOSIS — M542 Cervicalgia: Secondary | ICD-10-CM | POA: Diagnosis not present

## 2020-03-11 DIAGNOSIS — H8112 Benign paroxysmal vertigo, left ear: Secondary | ICD-10-CM

## 2020-03-11 NOTE — Progress Notes (Signed)
Subjective:     Patient ID: Rebecca Ray, female   DOB: November 25, 1956, 64 y.o.   MRN: [id number]  HPI   Rebecca Ray is seen with some dizziness which has been occurring since around early March.  She first noted when she rolled over in bed to her left one day.  She had a couple bouts since then.  Each bout usually lasts a few days and is worse early in the morning when rolling over.  She has had some mild nausea but no vomiting.  No headaches.  No hearing changes.  She has some bilateral tinnitus which is chronic and unchanged.  Last night she had some upper neck pain near the muscle attachment to the occiput and she wondered if that may be somehow related to her vertigo symptoms.  No syncope.  No chest pains.  No speech changes.  No swallowing difficulties.  No focal weakness.  No ataxia.   Past Medical History:  Diagnosis Date  . ALLERGIC RHINITIS 10/03/2009  . History of colonic polyp - adenoma 09/07/2014  . Seasonal allergies   . UTI (urinary tract infection)    Past Surgical History:  Procedure Laterality Date  . COLONOSCOPY    . TMJ ARTHROPLASTY    . TONSILLECTOMY    . VARICOSE VEIN SURGERY      reports that she has never smoked. She has never used smokeless tobacco. She reports current alcohol use of about 3.0 standard drinks of alcohol per week. She reports that she does not use drugs. family history includes Arthritis in an other family member; Atrial fibrillation in her father; Cirrhosis in her mother; Hyperlipidemia in an other family member; Stroke in an other family member. No Known Allergies   Review of Systems  Constitutional: Negative for fatigue.  Eyes: Negative for visual disturbance.  Respiratory: Negative for cough, chest tightness, shortness of breath and wheezing.   Cardiovascular: Negative for chest pain, palpitations and leg swelling.  Genitourinary: Negative for dysuria.  Neurological: Positive for dizziness. Negative for seizures, syncope, weakness,  light-headedness and headaches.       Objective:   Physical Exam Constitutional:      Appearance: She is well-developed.  Eyes:     Pupils: Pupils are equal, round, and reactive to light.  Neck:     Thyroid: No thyromegaly.     Vascular: No JVD.  Cardiovascular:     Rate and Rhythm: Normal rate and regular rhythm.     Heart sounds: No gallop.   Pulmonary:     Effort: Pulmonary effort is normal. No respiratory distress.     Breath sounds: Normal breath sounds. No wheezing or rales.  Musculoskeletal:     Cervical back: Neck supple.  Neurological:     General: No focal deficit present.     Mental Status: She is alert.     Cranial Nerves: No cranial nerve deficit.     Sensory: No sensory deficit.     Motor: No weakness.     Coordination: Coordination normal.     Gait: Gait normal.        Assessment:     #1 intermittent vertigo symptoms.  This seems to be triggered with movement to the left (by history)- though not reproducible in office at this time.  Symptoms sound consistent with likely benign peripheral positional vertigo to the left.  Her neuro exam at this time is nonfocal.  #2 mild posterior neck pain.  This sounds more muscular    Plan:     -  Conservative measures for neck pain with ice, massage, topical sports creams  -We discussed trial of Epley maneuvers with handout given and reviewed those.  If not resolving with home Epley maneuvers be in touch over the next week or 2.  We also reviewed red flag symptoms things to watch out for for more worrisome vertigo  Eulas Post MD [facility] at [facility]

## 2020-03-11 NOTE — Patient Instructions (Signed)
Benign Positional Vertigo Vertigo is the feeling that you or your surroundings are moving when they are not. Benign positional vertigo is the most common form of vertigo. This is usually a harmless condition (benign). This condition is positional. This means that symptoms are triggered by certain movements and positions. This condition can be dangerous if it occurs while you are doing something that could cause harm to you or others. This includes activities such as driving or operating machinery. What are the causes? In many cases, the cause of this condition is not known. It may be caused by a disturbance in an area of the inner ear that helps your brain to sense movement and balance. This disturbance can be caused by:  Viral infection (labyrinthitis).  Head injury.  Repetitive motion, such as jumping, dancing, or running. What increases the risk? You are more likely to develop this condition if:  You are a woman.  You are 50 years of age or older. What are the signs or symptoms? Symptoms of this condition usually happen when you move your head or your eyes in different directions. Symptoms may start suddenly, and usually last for less than a minute. They include:  Loss of balance and falling.  Feeling like you are spinning or moving.  Feeling like your surroundings are spinning or moving.  Nausea and vomiting.  Blurred vision.  Dizziness.  Involuntary eye movement (nystagmus). Symptoms can be mild and cause only minor problems, or they can be severe and interfere with daily life. Episodes of benign positional vertigo may return (recur) over time. Symptoms may improve over time. How is this diagnosed? This condition may be diagnosed based on:  Your medical history.  Physical exam of the head, neck, and ears.  Tests, such as: ? MRI. ? CT scan. ? Eye movement tests. Your health care provider may ask you to change positions quickly while he or she watches you for symptoms  of benign positional vertigo, such as nystagmus. Eye movement may be tested with a variety of exams that are designed to evaluate or stimulate vertigo. ? An electroencephalogram (EEG). This records electrical activity in your brain. ? Hearing tests. You may be referred to a health care provider who specializes in ear, nose, and throat (ENT) problems (otolaryngologist) or a provider who specializes in disorders of the nervous system (neurologist). How is this treated?  This condition may be treated in a session in which your health care provider moves your head in specific positions to adjust your inner ear back to normal. Treatment for this condition may take several sessions. Surgery may be needed in severe cases, but this is rare. In some cases, benign positional vertigo may resolve on its own in 2-4 weeks. Follow these instructions at home: Safety  Move slowly. Avoid sudden body or head movements or certain positions, as told by your health care provider.  Avoid driving until your health care provider says it is safe for you to do so.  Avoid operating heavy machinery until your health care provider says it is safe for you to do so.  Avoid doing any tasks that would be dangerous to you or others if vertigo occurs.  If you have trouble walking or keeping your balance, try using a cane for stability. If you feel dizzy or unstable, sit down right away.  Return to your normal activities as told by your health care provider. Ask your health care provider what activities are safe for you. General instructions  Take over-the-counter   and prescription medicines only as told by your health care provider.  Drink enough fluid to keep your urine pale yellow.  Keep all follow-up visits as told by your health care provider. This is important. Contact a health care provider if:  You have a fever.  Your condition gets worse or you develop new symptoms.  Your family or friends notice any  behavioral changes.  You have nausea or vomiting that gets worse.  You have numbness or a "pins and needles" sensation. Get help right away if you:  Have difficulty speaking or moving.  Are always dizzy.  Faint.  Develop severe headaches.  Have weakness in your legs or arms.  Have changes in your hearing or vision.  Develop a stiff neck.  Develop sensitivity to light. Summary  Vertigo is the feeling that you or your surroundings are moving when they are not. Benign positional vertigo is the most common form of vertigo.  The cause of this condition is not known. It may be caused by a disturbance in an area of the inner ear that helps your brain to sense movement and balance.  Symptoms include loss of balance and falling, feeling that you or your surroundings are moving, nausea and vomiting, and blurred vision.  This condition can be diagnosed based on symptoms, physical exam, and other tests, such as MRI, CT scan, eye movement tests, and hearing tests.  Follow safety instructions as told by your health care provider. You will also be told when to contact your health care provider in case of problems. This information is not intended to replace advice given to you by your health care provider. Make sure you discuss any questions you have with your health care provider. Document Revised: 04/30/2018 Document Reviewed: 04/30/2018 Elsevier Patient Education  2020 [employer]  

## 2020-08-29 DIAGNOSIS — L821 Other seborrheic keratosis: Secondary | ICD-10-CM | POA: Diagnosis not present

## 2020-08-29 DIAGNOSIS — L570 Actinic keratosis: Secondary | ICD-10-CM | POA: Diagnosis not present

## 2020-08-29 DIAGNOSIS — L720 Epidermal cyst: Secondary | ICD-10-CM | POA: Diagnosis not present

## 2020-08-29 DIAGNOSIS — D1801 Hemangioma of skin and subcutaneous tissue: Secondary | ICD-10-CM | POA: Diagnosis not present

## 2020-09-20 DIAGNOSIS — Z01419 Encounter for gynecological examination (general) (routine) without abnormal findings: Secondary | ICD-10-CM | POA: Diagnosis not present

## 2020-09-20 DIAGNOSIS — Z1231 Encounter for screening mammogram for malignant neoplasm of breast: Secondary | ICD-10-CM | POA: Diagnosis not present

## 2020-10-07 DIAGNOSIS — L821 Other seborrheic keratosis: Secondary | ICD-10-CM | POA: Diagnosis not present

## 2020-10-07 DIAGNOSIS — L570 Actinic keratosis: Secondary | ICD-10-CM | POA: Diagnosis not present

## 2021-09-01 DIAGNOSIS — Z85828 Personal history of other malignant neoplasm of skin: Secondary | ICD-10-CM | POA: Diagnosis not present

## 2021-09-01 DIAGNOSIS — L570 Actinic keratosis: Secondary | ICD-10-CM | POA: Diagnosis not present

## 2021-09-01 DIAGNOSIS — L720 Epidermal cyst: Secondary | ICD-10-CM | POA: Diagnosis not present

## 2021-09-01 DIAGNOSIS — L813 Cafe au lait spots: Secondary | ICD-10-CM | POA: Diagnosis not present

## 2021-09-01 DIAGNOSIS — C44619 Basal cell carcinoma of skin of left upper limb, including shoulder: Secondary | ICD-10-CM | POA: Diagnosis not present

## 2021-09-01 DIAGNOSIS — D225 Melanocytic nevi of trunk: Secondary | ICD-10-CM | POA: Diagnosis not present

## 2021-09-01 DIAGNOSIS — L821 Other seborrheic keratosis: Secondary | ICD-10-CM | POA: Diagnosis not present

## 2021-10-04 DIAGNOSIS — Z7689 Persons encountering health services in other specified circumstances: Secondary | ICD-10-CM | POA: Diagnosis not present

## 2021-10-04 DIAGNOSIS — Z1231 Encounter for screening mammogram for malignant neoplasm of breast: Secondary | ICD-10-CM | POA: Diagnosis not present

## 2021-10-10 ENCOUNTER — Other Ambulatory Visit: Payer: Self-pay

## 2021-10-10 ENCOUNTER — Encounter: Payer: Self-pay | Admitting: Family Medicine

## 2021-10-10 ENCOUNTER — Ambulatory Visit (INDEPENDENT_AMBULATORY_CARE_PROVIDER_SITE_OTHER): Payer: Medicare Other | Admitting: Family Medicine

## 2021-10-10 VITALS — BP 120/70 | HR 90 | Temp 98.2°F | Ht 67.0 in | Wt 143.4 lb

## 2021-10-10 DIAGNOSIS — Z0000 Encounter for general adult medical examination without abnormal findings: Secondary | ICD-10-CM | POA: Diagnosis not present

## 2021-10-10 DIAGNOSIS — M25512 Pain in left shoulder: Secondary | ICD-10-CM | POA: Diagnosis not present

## 2021-10-10 DIAGNOSIS — Z780 Asymptomatic menopausal state: Secondary | ICD-10-CM | POA: Diagnosis not present

## 2021-10-10 LAB — CBC WITH DIFFERENTIAL/PLATELET
Basophils Absolute: 0 10*3/uL (ref 0.0–0.1)
Basophils Relative: 0.7 % (ref 0.0–3.0)
Eosinophils Absolute: 0 10*3/uL (ref 0.0–0.7)
Eosinophils Relative: 0.6 % (ref 0.0–5.0)
HCT: 42.3 % (ref 36.0–46.0)
Hemoglobin: 14.1 g/dL (ref 12.0–15.0)
Lymphocytes Relative: 43.3 % (ref 12.0–46.0)
Lymphs Abs: 1.3 10*3/uL (ref 0.7–4.0)
MCHC: 33.4 g/dL (ref 30.0–36.0)
MCV: 95.6 fl (ref 78.0–100.0)
Monocytes Absolute: 0.4 10*3/uL (ref 0.1–1.0)
Monocytes Relative: 13.7 % — ABNORMAL HIGH (ref 3.0–12.0)
Neutro Abs: 1.3 10*3/uL — ABNORMAL LOW (ref 1.4–7.7)
Neutrophils Relative %: 41.7 % — ABNORMAL LOW (ref 43.0–77.0)
Platelets: 242 10*3/uL (ref 150.0–400.0)
RBC: 4.42 Mil/uL (ref 3.87–5.11)
RDW: 13.1 % (ref 11.5–15.5)
WBC: 3.1 10*3/uL — ABNORMAL LOW (ref 4.0–10.5)

## 2021-10-10 LAB — BASIC METABOLIC PANEL
BUN: 13 mg/dL (ref 6–23)
CO2: 30 mEq/L (ref 19–32)
Calcium: 9.8 mg/dL (ref 8.4–10.5)
Chloride: 101 mEq/L (ref 96–112)
Creatinine, Ser: 0.66 mg/dL (ref 0.40–1.20)
GFR: 92.25 mL/min (ref >60.00)
Glucose, Bld: 94 mg/dL (ref 70–99)
Potassium: 4.1 mEq/L (ref 3.5–5.1)
Sodium: 140 mEq/L (ref 135–145)

## 2021-10-10 LAB — HEPATIC FUNCTION PANEL
ALT: 13 U/L (ref 0–35)
AST: 18 U/L (ref 0–37)
Albumin: 4.7 g/dL (ref 3.5–5.2)
Alkaline Phosphatase: 55 U/L (ref 39–117)
Bilirubin, Direct: 0.1 mg/dL (ref 0.0–0.3)
Total Bilirubin: 0.5 mg/dL (ref 0.2–1.2)
Total Protein: 7.3 g/dL (ref 6.0–8.3)

## 2021-10-10 LAB — TSH: TSH: 3.03 u[IU]/mL (ref 0.35–5.50)

## 2021-10-10 LAB — LIPID PANEL
Cholesterol: 238 mg/dL — ABNORMAL HIGH (ref 0–200)
HDL: 89.1 mg/dL (ref >39.00)
LDL Cholesterol: 137 mg/dL — ABNORMAL HIGH (ref 0–99)
NonHDL: 148.98
Total CHOL/HDL Ratio: 3
Triglycerides: 61 mg/dL (ref 0.0–149.0)
VLDL: 12.2 mg/dL (ref 0.0–40.0)

## 2021-10-10 NOTE — Progress Notes (Signed)
    Subjective:    CC: L shoulder pain  I, Rebecca Ray, LAT, ATC, am serving as scribe for Dr. Lynne Leader.  HPI: Pt is a 65 y/o female presenting w/ c/o chronic L shoulder pain x one year that is progressively worsening.  She locates her pain to her L lateral, upper arm.  She notes that she recently had a basal cell carcinoma removed from her lateral upper arm which is also after the removal was treated with 5-fluorouracil and is still having some skin irritation as result.  The wound is not causing the pain at all.  But is not in a similar location.  L shoulder mechanical symptoms: No Neck pain: No Radiating pain: yes into L upper arm/deltoid region Aggravating factors: L shoulder IR; horizontal aDd; dressing (donning her bra) Treatments tried: Advil; Aspercreme; Biofreeze  Pertinent review of Systems: No fevers or chills  Relevant historical information: Hyperlipidemia.   Objective:    Vitals:   10/11/21 0925  BP: 120/78  Pulse: 85  SpO2: 98%   General: Well Developed, well nourished, and in no acute distress.   MSK: Left shoulder: Small 1 cm erythematous wound left lateral shoulder.  Otherwise normal-appearing Normal shoulder motion pain with abduction and internal rotation. Strength 4/5 to abduction and 4+/5 external rotation. Positive Hawkins and Neer's test.  Positive empty can test. Negative Yergason's and speeds test. Pulses capillary fill and sensation are intact distally.   Lab and Radiology Results    X-ray images left shoulder obtained today personally and inability interpreted  No acute fractures.  Mild DJD.  Await formal radiology review   Impression and Recommendations:    Assessment and Plan: 65 y.o. female with left shoulder pain due to subacromial bursitis/impingement.  Plan for physical therapy working on rotator cuff and periscapular strengthening. Recheck in about a weeks.  If not better would consider injection or MRI. Would like to  avoid ultrasound of this area now because I will have to please see ultrasound probe over the wound on her shoulder which may cause infection.  Additionally the injection would typically be done right around the area where she has the wound which could cause an infection.  PDMP not reviewed this encounter. Orders Placed This Encounter  Procedures   DG Shoulder Left    Standing Status:   Future    Number of Occurrences:   1    Standing Expiration Date:   11/09/2021    Order Specific Question:   Reason for Exam (SYMPTOM  OR DIAGNOSIS REQUIRED)    Answer:   L shoulder pain    Order Specific Question:   Preferred imaging location?    Answer:   [facility]   Ambulatory referral to Physical Therapy    Referral Priority:   Routine    Referral Type:   Physical Medicine    Referral Reason:   Specialty Services Required    Requested Specialty:   Physical Therapy    Number of Visits Requested:   1   No orders of the defined types were placed in this encounter.   Discussed warning signs or symptoms. Please see discharge instructions. Patient expresses understanding.   The above documentation has been reviewed and is accurate and complete Lynne Leader, M.D.

## 2021-10-10 NOTE — Progress Notes (Signed)
Established Patient Office Visit  Subjective:  Patient ID: Rebecca Ray, female    DOB: September 01, 1956  Age: 66 y.o. MRN: [id number]  CC:  Chief Complaint  Patient presents with   Annual Exam    HPI NYELAH EMMERICH presents for physical exam.  She sees dermatologist and in fact had recent squamous cell removed from her left arm.  Sees gynecologist regularly.  Pap smears and mammograms up-to-date.  Due for repeat colonoscopy next year.  Tetanus up-to-date.  She declines flu vaccine, pneumonia vaccine, and shingles vaccine.  Her major complaint is almost 1 year history of progressive left shoulder pain.  No neck pain.  Becoming more bothersome to change clothes.  Patient has pain with internal rotation of the shoulder.  Pain radiates from her shoulder to the deltoid region.  Family history reviewed.  Father is alive age 33.  History of atrial fibrillation.  Mother died age 56 of nonalcoholic cirrhosis.  She thinks this was nonalcoholic fatty liver disease.  Apparently has a sister with nonalcoholic fatty liver disease as well.  No history of diabetes.  Social history-she is married.  Has 2 daughters.  One 81 year old grandson.  Never smoked.  No regular alcohol use.  Past Medical History:  Diagnosis Date   ALLERGIC RHINITIS 10/03/2009   History of colonic polyp - adenoma 09/07/2014   Seasonal allergies    UTI (urinary tract infection)     Past Surgical History:  Procedure Laterality Date   COLONOSCOPY     TMJ ARTHROPLASTY     TONSILLECTOMY     VARICOSE VEIN SURGERY      Family History  Problem Relation Age of Onset   Cirrhosis Mother        non-alcoholic cirrhosis   Atrial fibrillation Father    Arthritis Other    Hyperlipidemia Other    Stroke Other    Colon cancer Neg Hx     Social History   Socioeconomic History   Marital status: Married    Spouse name: Not on file   Number of children: Not on file   Years of education: Not on file   Highest education level: Not  on file  Occupational History   Not on file  Tobacco Use   Smoking status: Never   Smokeless tobacco: Never  Substance and Sexual Activity   Alcohol use: Yes    Alcohol/week: 3.0 standard drinks    Types: 3 Cans of beer per week   Drug use: No   Sexual activity: Not on file  Other Topics Concern   Not on file  Social History Narrative   Not on file   Social Determinants of Health   Financial Resource Strain: Not on file  Food Insecurity: Not on file  Transportation Needs: Not on file  Physical Activity: Not on file  Stress: Not on file  Social Connections: Not on file  Intimate Partner Violence: Not on file    Outpatient Medications Prior to Visit  Medication Sig Dispense Refill   cetirizine (ZYRTEC) 10 MG tablet Take 10 mg by mouth daily.     fluticasone (FLONASE) 50 MCG/ACT nasal spray Place 2 sprays into the nose as needed.     No facility-administered medications prior to visit.    No Known Allergies  ROS Review of Systems  Constitutional:  Negative for activity change, appetite change, fatigue, fever and unexpected weight change.  HENT:  Negative for ear pain, hearing loss, sore throat and trouble swallowing.   Eyes:  Negative for visual disturbance.  Respiratory:  Negative for cough and shortness of breath.   Cardiovascular:  Negative for chest pain and palpitations.  Gastrointestinal:  Negative for abdominal pain, blood in stool, constipation and diarrhea.  Endocrine: Negative for polydipsia and polyuria.  Genitourinary:  Negative for dysuria and hematuria.  Musculoskeletal:  Negative for arthralgias, back pain and myalgias.  Skin:  Negative for rash.  Neurological:  Negative for dizziness, syncope and headaches.  Hematological:  Negative for adenopathy.  Psychiatric/Behavioral:  Negative for confusion and dysphoric mood.      Objective:    Physical Exam Vitals reviewed.  Constitutional:      General: She is not in acute distress.    Appearance:  Normal appearance. She is not toxic-appearing.  HENT:     Head: Normocephalic and atraumatic.     Ears:     Comments: Left ear canal clear.  Only minimal cerumen right canal. Cardiovascular:     Rate and Rhythm: Normal rate and regular rhythm.     Heart sounds: No murmur heard. Pulmonary:     Effort: Pulmonary effort is normal.     Breath sounds: Normal breath sounds.  Abdominal:     Palpations: Abdomen is soft. There is no mass.     Tenderness: There is no abdominal tenderness.  Musculoskeletal:     Cervical back: Neck supple.     Right lower leg: No edema.     Left lower leg: No edema.  Lymphadenopathy:     Cervical: No cervical adenopathy.  Skin:    Findings: No rash.  Neurological:     General: No focal deficit present.     Mental Status: She is alert.     Cranial Nerves: No cranial nerve deficit.  Psychiatric:        Mood and Affect: Mood normal.    BP 120/70 (BP Location: Left Arm, Patient Position: Sitting, Cuff Size: Normal)   Pulse 90   Temp 98.2 F (36.8 C) (Oral)   Ht 5\' 7"  (1.702 m)   Wt 143 lb 6.4 oz (65 kg)   SpO2 98%   BMI 22.46 kg/m  Wt Readings from Last 3 Encounters:  10/10/21 143 lb 6.4 oz (65 kg)  03/11/20 130 lb 14.4 oz (59.4 kg)  09/09/19 128 lb 12.8 oz (58.4 kg)     Health Maintenance Due  Topic Date Due   COVID-19 Vaccine (1) Never done   HIV Screening  Never done   DEXA SCAN  Never done    There are no preventive care reminders to display for this patient.  Lab Results  Component Value Date   TSH 4.49 02/10/2020   Lab Results  Component Value Date   WBC 3.4 (L) 09/09/2019   HGB 14.2 09/09/2019   HCT 42.0 09/09/2019   MCV 96.6 09/09/2019   PLT 252.0 09/09/2019   Lab Results  Component Value Date   NA 140 09/09/2019   K 4.6 09/09/2019   CO2 32 09/09/2019   GLUCOSE 94 09/09/2019   BUN 15 09/09/2019   CREATININE 0.67 09/09/2019   BILITOT 0.5 09/09/2019   ALKPHOS 60 09/09/2019   AST 19 09/09/2019   ALT 16 09/09/2019    PROT 7.0 09/09/2019   ALBUMIN 4.8 09/09/2019   CALCIUM 10.3 09/09/2019   GFR 88.89 09/09/2019   Lab Results  Component Value Date   CHOL 276 (H) 09/09/2019   Lab Results  Component Value Date   HDL 92.10 09/09/2019   Lab Results  Component Value Date   LDLCALC 170 (H) 09/09/2019   Lab Results  Component Value Date   TRIG 68.0 09/09/2019   Lab Results  Component Value Date   CHOLHDL 3 09/09/2019   No results found for: HGBA1C    Assessment & Plan:   Problem List Items Addressed This Visit   None Visit Diagnoses     Postmenopause    -  Primary   Relevant Orders   DG Bone Density   Physical exam       Relevant Orders   Basic metabolic panel   Lipid panel   CBC with Differential/Platelet   TSH   Hepatic function panel     We offered vaccines including flu vaccine, shingles vaccine, Prevnar 20 and she declines each of these.  -Mammogram and Pap smear is up-to-date and she gets these through GYN  -We did recommend setting up screening DEXA scan and she agrees  -Continue regular weightbearing exercise and daily calcium and vitamin D  -Obtain screening labs as above  -Set up sports medicine referral regarding her chronic left shoulder pain for several months.  Suspect she probably has some rotator cuff tendinitis.  No orders of the defined types were placed in this encounter.   Follow-up: No follow-ups on file.    Carolann Littler, MD

## 2021-10-11 ENCOUNTER — Ambulatory Visit: Payer: Self-pay

## 2021-10-11 ENCOUNTER — Encounter: Payer: Self-pay | Admitting: Family Medicine

## 2021-10-11 ENCOUNTER — Ambulatory Visit: Payer: Medicare Other | Admitting: Family Medicine

## 2021-10-11 ENCOUNTER — Ambulatory Visit (INDEPENDENT_AMBULATORY_CARE_PROVIDER_SITE_OTHER): Payer: Medicare Other

## 2021-10-11 VITALS — BP 120/78 | HR 85 | Ht 67.0 in | Wt 144.8 lb

## 2021-10-11 DIAGNOSIS — M25512 Pain in left shoulder: Secondary | ICD-10-CM

## 2021-10-11 DIAGNOSIS — G8929 Other chronic pain: Secondary | ICD-10-CM

## 2021-10-11 DIAGNOSIS — M19012 Primary osteoarthritis, left shoulder: Secondary | ICD-10-CM | POA: Diagnosis not present

## 2021-10-11 NOTE — Patient Instructions (Addendum)
Nice to meet you today.  Please get an Xray today before you leave.  I've referred you to Physical Therapy.  Please let us know if you have note heard back regarding scheduling within one week.  Follow-up: 8 weeks

## 2021-10-12 NOTE — Progress Notes (Signed)
Left shoulder x-ray shows some mild arthritis changes.

## 2021-10-17 DIAGNOSIS — M25512 Pain in left shoulder: Secondary | ICD-10-CM | POA: Diagnosis not present

## 2021-10-20 DIAGNOSIS — M25512 Pain in left shoulder: Secondary | ICD-10-CM | POA: Diagnosis not present

## 2021-10-24 DIAGNOSIS — M25512 Pain in left shoulder: Secondary | ICD-10-CM | POA: Diagnosis not present

## 2021-10-27 DIAGNOSIS — M25512 Pain in left shoulder: Secondary | ICD-10-CM | POA: Diagnosis not present

## 2021-10-31 DIAGNOSIS — M25512 Pain in left shoulder: Secondary | ICD-10-CM | POA: Diagnosis not present

## 2021-11-03 DIAGNOSIS — M25512 Pain in left shoulder: Secondary | ICD-10-CM | POA: Diagnosis not present

## 2021-11-07 DIAGNOSIS — M25512 Pain in left shoulder: Secondary | ICD-10-CM | POA: Diagnosis not present

## 2021-11-10 DIAGNOSIS — M25512 Pain in left shoulder: Secondary | ICD-10-CM | POA: Diagnosis not present

## 2021-11-14 DIAGNOSIS — M25512 Pain in left shoulder: Secondary | ICD-10-CM | POA: Diagnosis not present

## 2021-11-17 DIAGNOSIS — M25512 Pain in left shoulder: Secondary | ICD-10-CM | POA: Diagnosis not present

## 2021-12-05 NOTE — Progress Notes (Signed)
I, Wendy Poet, LAT, ATC, am serving as scribe for Dr. Lynne Leader.  Rebecca Ray is a 66 y.o. female who presents to [place] at [facility] today for f/u of L shoulder pain due to subacromial bursitis/impingement.  She was last seen by Dr. Georgina Snell on 10/11/21 and was referred to [facility] PT, completing about 8 visits.  Today, pt reports she has been working on [employer]. Pt notes much benefit from PT, 80% improvement. Pt notes increased in her AROM and decrease in L shoulder pain.  She however notes pain in the base of her right thumb.  This has been ongoing for several months worsening recently.  She notes her mom had a similar problem that ultimately required significant surgery due to arthritis.  She is tried some over-the-counter medicines for pain which have not helped sufficiently.  She is right-hand dominant.  Diagnostic testing: L shoulder XR- 10/11/21  Pertinent review of systems: No fevers or chills  Relevant historical information: Seasonal allergies   Exam:  BP 130/86    Pulse 82    Ht 5\' 7"  (1.702 m)    Wt 148 lb 9.6 oz (67.4 kg)    SpO2 98%    BMI 23.27 kg/m  General: Well Developed, well nourished, and in no acute distress.   MSK: Left shoulder normal.  Normal motion.  Right hand bossing at first [facility].  Tender palpation.  Normal thumb motion.    Lab and Radiology Results  Procedure: Real-time Ultrasound Guided Injection of right hand first [facility] Device: Philips Affiniti 50G Images permanently stored and available for review in PACS Verbal informed consent obtained.  Discussed risks and benefits of procedure. Warned about infection bleeding damage to structures skin hypopigmentation and fat atrophy among others. Patient expresses understanding and agreement Time-out conducted.   Noted no overlying erythema, induration, or other signs of local infection.   Skin prepped in a sterile fashion.   Local anesthesia: Topical Ethyl chloride.   With sterile  technique and under real time ultrasound guidance: 20 mg of Kenalog and 0.5 mg of lidocaine injected into first [place]. Fluid seen entering the joint capsule.   Completed without difficulty   Pain immediately resolved suggesting accurate placement of the medication.   Advised to call if fevers/chills, erythema, induration, drainage, or persistent bleeding.   Images permanently stored and available for review in the ultrasound unit.  Impression: Technically successful ultrasound guided injection.   X-ray images right hand obtained today personally and independently interpreted First [facility] DJD.  No acute fractures. Await formal radiology review     Assessment and Plan: 66 y.o. female with right hand pain at first [facility] due to DJD.  Plan for injection today.  Left shoulder pain significantly improved with physical therapy.  Plan to continue home exercise program and recheck back as needed.   PDMP not reviewed this encounter. Orders Placed This Encounter  Procedures   [place] LIMITED JOINT SPACE STRUCTURES UP RIGHT(NO LINKED CHARGES)    Standing Status:   Future    Number of Occurrences:   1    Standing Expiration Date:   06/05/2022    Order Specific Question:   Reason for Exam (SYMPTOM  OR DIAGNOSIS REQUIRED)    Answer:   right hand pain    Order Specific Question:   Preferred imaging location?    Answer:   [place]   DG Hand Complete Right    Standing Status:   Future  Number of Occurrences:   1    Standing Expiration Date:   12/06/2022    Order Specific Question:   Reason for Exam (SYMPTOM  OR DIAGNOSIS REQUIRED)    Answer:   right hand pain    Order Specific Question:   Preferred imaging location?    Answer:   [facility]   No orders of the defined types were placed in this encounter.    Discussed warning signs or symptoms. Please see discharge instructions. Patient expresses understanding.   The above documentation has been reviewed and is  accurate and complete Lynne Leader, M.D.

## 2021-12-06 ENCOUNTER — Other Ambulatory Visit: Payer: Self-pay

## 2021-12-06 ENCOUNTER — Ambulatory Visit: Payer: Medicare Other | Admitting: Family Medicine

## 2021-12-06 ENCOUNTER — Ambulatory Visit: Payer: Self-pay

## 2021-12-06 ENCOUNTER — Ambulatory Visit (INDEPENDENT_AMBULATORY_CARE_PROVIDER_SITE_OTHER): Payer: Medicare Other

## 2021-12-06 VITALS — BP 130/86 | HR 82 | Ht 67.0 in | Wt 148.6 lb

## 2021-12-06 DIAGNOSIS — G8929 Other chronic pain: Secondary | ICD-10-CM

## 2021-12-06 DIAGNOSIS — M79641 Pain in right hand: Secondary | ICD-10-CM

## 2021-12-06 DIAGNOSIS — M25512 Pain in left shoulder: Secondary | ICD-10-CM | POA: Diagnosis not present

## 2021-12-06 DIAGNOSIS — M1811 Unilateral primary osteoarthritis of first carpometacarpal joint, right hand: Secondary | ICD-10-CM | POA: Diagnosis not present

## 2021-12-06 NOTE — Patient Instructions (Addendum)
Thank you for coming in today.   You received a steroid injection today. Seek immediate medical attention if the joint becomes red, extremely painful, or is oozing fluid.   Please get an Xray today before you leave   Recheck back as needed

## 2021-12-07 NOTE — Progress Notes (Signed)
Right hand x-ray does show some arthritis especially at the base of the thumb.

## 2022-01-08 DIAGNOSIS — H0288B Meibomian gland dysfunction left eye, upper and lower eyelids: Secondary | ICD-10-CM | POA: Diagnosis not present

## 2022-01-08 DIAGNOSIS — H0288A Meibomian gland dysfunction right eye, upper and lower eyelids: Secondary | ICD-10-CM | POA: Diagnosis not present

## 2022-01-08 DIAGNOSIS — D3131 Benign neoplasm of right choroid: Secondary | ICD-10-CM | POA: Diagnosis not present

## 2022-01-08 DIAGNOSIS — H2513 Age-related nuclear cataract, bilateral: Secondary | ICD-10-CM | POA: Diagnosis not present

## 2022-03-14 ENCOUNTER — Other Ambulatory Visit: Payer: Self-pay | Admitting: Family Medicine

## 2022-03-14 DIAGNOSIS — Z780 Asymptomatic menopausal state: Secondary | ICD-10-CM

## 2022-03-23 ENCOUNTER — Ambulatory Visit
Admission: RE | Admit: 2022-03-23 | Discharge: 2022-03-23 | Disposition: A | Discharge status: Home / Self Care | Payer: Medicare Other | Source: Ambulatory Visit | Attending: Family Medicine | Admitting: Family Medicine

## 2022-03-23 DIAGNOSIS — M810 Age-related osteoporosis without current pathological fracture: Secondary | ICD-10-CM | POA: Diagnosis not present

## 2022-03-23 DIAGNOSIS — M85832 Other specified disorders of bone density and structure, left forearm: Secondary | ICD-10-CM | POA: Diagnosis not present

## 2022-03-23 DIAGNOSIS — Z780 Asymptomatic menopausal state: Secondary | ICD-10-CM

## 2022-03-28 ENCOUNTER — Ambulatory Visit (INDEPENDENT_AMBULATORY_CARE_PROVIDER_SITE_OTHER): Payer: Medicare Other | Admitting: Family Medicine

## 2022-03-28 ENCOUNTER — Encounter: Payer: Self-pay | Admitting: Family Medicine

## 2022-03-28 DIAGNOSIS — M810 Age-related osteoporosis without current pathological fracture: Secondary | ICD-10-CM | POA: Diagnosis not present

## 2022-03-28 MED ORDER — ALENDRONATE SODIUM 70 MG PO TABS: 70.0000 mg | ORAL_TABLET | ORAL | 11 refills | Status: DC

## 2022-03-28 NOTE — Patient Instructions (Signed)
Make sure taking Vit D 1,000 to 2,000 IU daily and Calcium 1,200 mg daily  ?

## 2022-03-28 NOTE — Progress Notes (Signed)
? ?Established Patient Office Visit ? ?Subjective   ?Patient ID: Rebecca Ray, female    DOB: July 29, 1956  Age: 66 y.o. MRN: [id number] ? ?Chief Complaint  ?Patient presents with  ? Results  ?   ?  ? ? ?HPI ? ?Past Medical History:  ?Diagnosis Date  ? ALLERGIC RHINITIS 10/03/2009  ? History of colonic polyp - adenoma 09/07/2014  ? Seasonal allergies   ? UTI (urinary tract infection)   ? ?  ?Here to discuss recent DEXA scan.  This did show osteoporosis.  She had T score -2.9 hip.  T score left radius -2.1.  Lumbar spine was excluded secondary to degenerative changes. ? ?She has no history of fracture.  She exercises regularly.  Takes vitamin D 2000 international units daily.  Does not currently take any calcium.  Menopause age 13.  Had labs including chemistries and thyroid functions last fall which were normal.  No known family history of osteoporosis.  Patient was surprised by results given her active lifestyle.  She does not take any regular medications.  She has lost a little bit in height.  No history of known compression fracture. ? ?Past Medical History:  ?Diagnosis Date  ? ALLERGIC RHINITIS 10/03/2009  ? History of colonic polyp - adenoma 09/07/2014  ? Seasonal allergies   ? UTI (urinary tract infection)   ? ?Past Surgical History:  ?Procedure Laterality Date  ? COLONOSCOPY    ? TMJ ARTHROPLASTY    ? TONSILLECTOMY    ? VARICOSE VEIN SURGERY    ? ? reports that she has never smoked. She has never used smokeless tobacco. She reports current alcohol use of about 3.0 standard drinks per week. She reports that she does not use drugs. ?family history includes Arthritis in an other family member; Atrial fibrillation in her father; Cirrhosis in her mother; Hyperlipidemia in an other family member; Stroke in an other family member. ?No Known Allergies ? ?Review of Systems  ?Constitutional:  Negative for weight loss.  ?Respiratory:  Negative for shortness of breath.   ?Cardiovascular:  Negative for chest pain.   ?Musculoskeletal:  Negative for back pain.  ? ?  ?Objective:  ?  ? ?BP 114/70 (BP Location: Left Arm, Patient Position: Sitting, Cuff Size: Normal)   Pulse 70   Temp 98 ?F (36.7 ?C) (Oral)   Ht '5\' 7"'$  (1.702 m)   Wt 146 lb 9.6 oz (66.5 kg)   SpO2 95%   BMI 22.96 kg/m?  ?Wt Readings from Last 3 Encounters:  ?03/28/22 146 lb 9.6 oz (66.5 kg)  ?12/06/21 148 lb 9.6 oz (67.4 kg)  ?10/11/21 144 lb 12.8 oz (65.7 kg)  ? ?  ? ?Physical Exam ?Vitals reviewed.  ?Constitutional:   ?   Appearance: Normal appearance.  ?Cardiovascular:  ?   Rate and Rhythm: Normal rate and regular rhythm.  ?Musculoskeletal:     ?   General: No deformity.  ?Neurological:  ?   Mental Status: She is alert.  ? ? ? ?No results found for any visits on 03/28/22. ? ?Last CBC ?Lab Results  ?Component Value Date  ? WBC 3.1 (L) 10/10/2021  ? HGB 14.1 10/10/2021  ? HCT 42.3 10/10/2021  ? MCV 95.6 10/10/2021  ? RDW 13.1 10/10/2021  ? PLT 242.0 10/10/2021  ? ?Last metabolic panel ?Lab Results  ?Component Value Date  ? GLUCOSE 94 10/10/2021  ? NA 140 10/10/2021  ? K 4.1 10/10/2021  ? CL 101 10/10/2021  ? CO2 30 10/10/2021  ?  BUN 13 10/10/2021  ? CREATININE 0.66 10/10/2021  ? CALCIUM 9.8 10/10/2021  ? PROT 7.3 10/10/2021  ? ALBUMIN 4.7 10/10/2021  ? BILITOT 0.5 10/10/2021  ? ALKPHOS 55 10/10/2021  ? AST 18 10/10/2021  ? ALT 13 10/10/2021  ? ?Last thyroid functions ?Lab Results  ?Component Value Date  ? TSH 3.03 10/10/2021  ? ?Last vitamin D ?No results found for: 25OHVITD2, 25OHVITD3, VD25OH ?  ? ?The 10-year ASCVD risk score (Arnett DK, et al., 2019) is: 4% ? ?  ?Assessment & Plan:  ? ?Problem List Items Addressed This Visit   ? ?  ? Unprioritized  ? Osteoporosis  ? Relevant Medications  ? alendronate (FOSAMAX) 70 MG tablet  ?Recent T score -2.9 hip.  We recommend osteoporosis therapy.  We discussed several items as follows: ? ?-Continue regular weightbearing exercise including resistance training upper extremities ?-Recommend continued vitamin D 1000 to  2000 international units daily.  Also would recommend checking 25-hydroxy vitamin D with next labs ?-Also recommend at least 1200 mg calcium per day ?-Discussed osteoporosis therapies at some length.  We suggested starting bisphosphonate with Fosamax 70 mg weekly.  Reviewed potential side effects.  Reviewed proper way of taking medication.  She has no contraindications.  We did discuss very low risk of osteonecrosis and other site fractures ?-Consider repeat DEXA in 1 year to 2 years depending on insurance coverage. ? ?No follow-ups on file.  ? ? ?Carolann Littler, MD ? ?

## 2022-08-08 ENCOUNTER — Telehealth: Payer: Self-pay | Admitting: Family Medicine

## 2022-08-08 NOTE — Telephone Encounter (Signed)
Left message for patient to call back and schedule Medicare Annual Wellness Visit (AWV) either virtually or in office. Left  my Herbie Drape number [phone]   awvi 08/03/22 per [facility]  ; please schedule at anytime with [facility] Nurse Health Advisor 1 or 2   This should be a 45 minute visit.

## 2022-08-08 NOTE — Telephone Encounter (Signed)
Patient return my call  Patient declined  do not call Not interested

## 2022-08-31 ENCOUNTER — Ambulatory Visit (INDEPENDENT_AMBULATORY_CARE_PROVIDER_SITE_OTHER): Payer: Medicare Other | Admitting: Family Medicine

## 2022-08-31 ENCOUNTER — Ambulatory Visit: Payer: Medicare Other | Attending: Family Medicine

## 2022-08-31 ENCOUNTER — Encounter: Payer: Self-pay | Admitting: Family Medicine

## 2022-08-31 VITALS — BP 138/80 | HR 88 | Temp 97.9°F | Ht 67.0 in | Wt 146.4 lb

## 2022-08-31 DIAGNOSIS — R002 Palpitations: Secondary | ICD-10-CM

## 2022-08-31 DIAGNOSIS — R0789 Other chest pain: Secondary | ICD-10-CM | POA: Diagnosis not present

## 2022-08-31 NOTE — Progress Notes (Unsigned)
ZIO XT mailed to pt's home address. 

## 2022-08-31 NOTE — Progress Notes (Unsigned)
Established Patient Office Visit  Subjective   Patient ID: Rebecca Ray, female    DOB: 03-09-1956  Age: 66 y.o. MRN: [id number]  Chief Complaint  Patient presents with   Follow-up   Palpitations    Patient complains of heart palpitations,     HPI  {History (Optional):23778} Rebecca Ray is seen with recent increased palpitations.  She has had these for some time.  She gave up caffeine several months ago and does still drink wine at night but wine does not seem to be a trigger.  She has noted that beer does seem to trigger these.  She feels like stress might as well.  She recently had intermittent burning sensation left chest and occasional right-sided and substernal pressure but not with activity.  She had some episodes at rest but never with activity.  She is done recent exertional things like yard work without symptoms.  She also has noticed that her palpitations tend to be worse sitting but not standing.  She denies any associated nausea, diaphoresis, or left arm symptoms.  Burning sensation left chest was very fleeting.  She had called yesterday regarding this and was advised to call 911.  Paramedics came out and ran EKG and rhythm strip which she brings in today for review.  This showed sinus rhythm with no acute changes.  She has had palpitations for quite some time.  She had thyroid functions and labs last November which were normal.  Her father does have history of atrial fibrillation.  Past Medical History:  Diagnosis Date   ALLERGIC RHINITIS 10/03/2009   History of colonic polyp - adenoma 09/07/2014   Seasonal allergies    UTI (urinary tract infection)    Past Surgical History:  Procedure Laterality Date   COLONOSCOPY     TMJ ARTHROPLASTY     TONSILLECTOMY     VARICOSE VEIN SURGERY      reports that she has never smoked. She has never used smokeless tobacco. She reports current alcohol use of about 3.0 standard drinks of alcohol per week. She reports that she does not use  drugs. family history includes Arthritis in an other family member; Atrial fibrillation in her father; Cirrhosis in her mother; Hyperlipidemia in an other family member; Stroke in an other family member. No Known Allergies  Review of Systems  Constitutional:  Negative for malaise/fatigue.  Eyes:  Negative for blurred vision.  Respiratory:  Negative for cough and shortness of breath.   Cardiovascular:  Positive for palpitations. Negative for leg swelling.       See HPI  Neurological:  Negative for dizziness, weakness and headaches.      Objective:     BP 138/80 (BP Location: Left Arm, Patient Position: Sitting, Cuff Size: Normal)   Pulse 88   Temp 97.9 F (36.6 C) (Oral)   Ht '5\' 7"'$  (1.702 m)   Wt 146 lb 6.4 oz (66.4 kg)   SpO2 98%   BMI 22.93 kg/m  {Vitals History (Optional):23777}  Physical Exam Vitals reviewed.  HENT:     Head: Normocephalic and atraumatic.  Cardiovascular:     Rate and Rhythm: Normal rate and regular rhythm.     Heart sounds: No murmur heard.    No gallop.  Pulmonary:     Effort: Pulmonary effort is normal.     Breath sounds: Normal breath sounds. No wheezing or rales.  Musculoskeletal:     Right lower leg: No edema.     Left lower leg: No edema.  No results found for any visits on 08/31/22.  {Labs (Optional):23779}  The 10-year ASCVD risk score (Arnett DK, et al., 2019) is: 6.5%    Assessment & Plan:   Problem List Items Addressed This Visit   None Visit Diagnoses     Palpitations    -  Primary   Relevant Orders   LONG TERM MONITOR (3-14 DAYS)     [place] has several month history of palpitations which occur sporadically and tend to be worse at rest and also seated and triggered by things like beer.  Suspect she probably has some symptomatic PACs or PVCs.  EKG as above reviewed with no acute findings.  She also describes some episodes of intermittent left chest burning and right-sided and substernal pressure but never with  activity.  Symptoms are very atypical.  Doubt cardiac.  She stays active and has never had symptoms with activity.  Low risk for CAD  -Set up Zio patch cardiac monitor for 7 days -Avoid alcohol as much as possible as well as caffeine -Discussed typical signs and symptoms for angina to watch out for  No follow-ups on file.    Carolann Littler, MD

## 2022-09-04 DIAGNOSIS — R002 Palpitations: Secondary | ICD-10-CM | POA: Diagnosis not present

## 2022-09-18 DIAGNOSIS — R002 Palpitations: Secondary | ICD-10-CM | POA: Diagnosis not present

## 2022-09-19 DIAGNOSIS — L570 Actinic keratosis: Secondary | ICD-10-CM | POA: Diagnosis not present

## 2022-09-19 DIAGNOSIS — D2262 Melanocytic nevi of left upper limb, including shoulder: Secondary | ICD-10-CM | POA: Diagnosis not present

## 2022-09-19 DIAGNOSIS — L814 Other melanin hyperpigmentation: Secondary | ICD-10-CM | POA: Diagnosis not present

## 2022-09-19 DIAGNOSIS — D2271 Melanocytic nevi of right lower limb, including hip: Secondary | ICD-10-CM | POA: Diagnosis not present

## 2022-09-19 DIAGNOSIS — D485 Neoplasm of uncertain behavior of skin: Secondary | ICD-10-CM | POA: Diagnosis not present

## 2022-09-19 DIAGNOSIS — Z85828 Personal history of other malignant neoplasm of skin: Secondary | ICD-10-CM | POA: Diagnosis not present

## 2022-09-19 DIAGNOSIS — D2272 Melanocytic nevi of left lower limb, including hip: Secondary | ICD-10-CM | POA: Diagnosis not present

## 2022-09-19 DIAGNOSIS — L821 Other seborrheic keratosis: Secondary | ICD-10-CM | POA: Diagnosis not present

## 2022-09-19 DIAGNOSIS — D2261 Melanocytic nevi of right upper limb, including shoulder: Secondary | ICD-10-CM | POA: Diagnosis not present

## 2022-09-24 ENCOUNTER — Telehealth: Payer: Self-pay | Admitting: Family Medicine

## 2022-09-24 NOTE — Telephone Encounter (Signed)
Please see result note 

## 2022-09-24 NOTE — Telephone Encounter (Signed)
Patient requesting a call to discuss heart monitor results

## 2022-11-01 ENCOUNTER — Encounter: Payer: Self-pay | Admitting: Internal Medicine

## 2022-12-11 ENCOUNTER — Encounter: Payer: Self-pay | Admitting: Internal Medicine

## 2022-12-20 ENCOUNTER — Ambulatory Visit: Payer: Self-pay

## 2022-12-20 ENCOUNTER — Ambulatory Visit: Payer: Medicare Other | Admitting: Family Medicine

## 2022-12-20 VITALS — BP 124/78 | HR 80 | Ht 67.0 in | Wt 151.0 lb

## 2022-12-20 DIAGNOSIS — M79641 Pain in right hand: Secondary | ICD-10-CM | POA: Diagnosis not present

## 2022-12-20 DIAGNOSIS — G8929 Other chronic pain: Secondary | ICD-10-CM

## 2022-12-20 DIAGNOSIS — M1811 Unilateral primary osteoarthritis of first carpometacarpal joint, right hand: Secondary | ICD-10-CM

## 2022-12-20 NOTE — Progress Notes (Signed)
I, Peterson Lombard, LAT, ATC acting as a scribe for Lynne Leader, MD.  Rebecca Ray is a 67 y.o. female who presents to [place] at [facility] today for cont'd R thumb pain. She is R-hand dominate. Pt was last seen by Dr. Georgina Snell on 12/06/21 and was given a R 1st [place] steroid injection. Today, pt reports R thumb pain returning gradually over the last month.  Pt locates pain to the radial aspect of the 1st MC and and MCP joint.   Dx imaging: 12/06/21 R hand XR  Pertinent review of systems: No fevers or chills  Relevant historical information: Hyperlipidemia and hand arthritis.   Exam:  BP 124/78   Pulse 80   Ht '5\' 7"'$  (1.702 m)   Wt 151 lb (68.5 kg)   SpO2 98%   BMI 23.65 kg/m  General: Well Developed, well nourished, and in no acute distress.   MSK: Right hand degenerative changes base of thumb.  Tender to palpation.  Decreased motion.    Lab and Radiology Results  Procedure: Real-time Ultrasound Guided Injection of right thumb first [facility] Device: Philips Affiniti 50G Images permanently stored and available for review in PACS Verbal informed consent obtained.  Discussed risks and benefits of procedure. Warned about infection, bleeding, hyperglycemia damage to structures among others. Patient expresses understanding and agreement Time-out conducted.   Noted no overlying erythema, induration, or other signs of local infection.   Skin prepped in a sterile fashion.   Local anesthesia: Topical Ethyl chloride.   With sterile technique and under real time ultrasound guidance: 20 mg of Kenalog and 0.5 mL of lidocaine injected into first [place]. Fluid seen entering the joint capsule.   Completed without difficulty   Pain immediately resolved suggesting accurate placement of the medication.   Advised to call if fevers/chills, erythema, induration, drainage, or persistent bleeding.   Images permanently stored and available for review in the ultrasound unit.  Impression:  Technically successful ultrasound guided injection.    EXAM: RIGHT HAND - COMPLETE 3+ VIEW   COMPARISON:  None.   FINDINGS: No recent fracture or dislocation is seen. Degenerative changes are noted in the interphalangeal joints, more so in the distal interphalangeal joint of the fifth finger. Degenerative changes are noted in first carpometacarpal joint. Small bony spurs seen in the first and second metacarpophalangeal joints.   IMPRESSION: No recent fracture or dislocation is seen in the right hand. Degenerative changes are noted in multiple joints as described in the body of the report.     Electronically Signed   By: Elmer Picker M.D.   On: 12/06/2021 14:50   I, Lynne Leader, personally (independently) visualized and performed the interpretation of the images attached in this note.     Assessment and Plan: 67 y.o. female with right hand pain due to DJD especially at the first [facility].  Plan for repeat injection for [place].  Last injection was over a year ago December 06, 2021.  Recheck back as needed.  Consider occupational therapy as needed for this issue as well.   PDMP not reviewed this encounter. Orders Placed This Encounter  Procedures   [place] LIMITED JOINT SPACE STRUCTURES UP RIGHT(NO LINKED CHARGES)    Order Specific Question:   Reason for Exam (SYMPTOM  OR DIAGNOSIS REQUIRED)    Answer:   right thumb pain    Order Specific Question:   Preferred imaging location?    Answer:   [place]   No orders  of the defined types were placed in this encounter.    Discussed warning signs or symptoms. Please see discharge instructions. Patient expresses understanding.   The above documentation has been reviewed and is accurate and complete Lynne Leader, M.D.

## 2022-12-20 NOTE — Patient Instructions (Addendum)
Thank you for coming in today.   You received an injection today. Seek immediate medical attention if the joint becomes red, extremely painful, or is oozing fluid.   Let me know if this doesn't work  Check back as needed

## 2023-01-03 ENCOUNTER — Ambulatory Visit (AMBULATORY_SURGERY_CENTER): Payer: Medicare Other

## 2023-01-03 VITALS — Ht 66.0 in | Wt 146.0 lb

## 2023-01-03 DIAGNOSIS — Z86010 Personal history of colonic polyps: Secondary | ICD-10-CM

## 2023-01-03 DIAGNOSIS — N952 Postmenopausal atrophic vaginitis: Secondary | ICD-10-CM | POA: Insufficient documentation

## 2023-01-03 NOTE — Progress Notes (Signed)

## 2023-01-11 DIAGNOSIS — Z1231 Encounter for screening mammogram for malignant neoplasm of breast: Secondary | ICD-10-CM | POA: Diagnosis not present

## 2023-01-18 ENCOUNTER — Encounter: Payer: Self-pay | Admitting: Internal Medicine

## 2023-01-30 ENCOUNTER — Other Ambulatory Visit: Payer: Self-pay | Admitting: Family Medicine

## 2023-01-31 ENCOUNTER — Encounter: Payer: Self-pay | Admitting: Internal Medicine

## 2023-01-31 ENCOUNTER — Ambulatory Visit (AMBULATORY_SURGERY_CENTER): Payer: Medicare Other | Admitting: Internal Medicine

## 2023-01-31 VITALS — BP 121/67 | HR 87 | Resp 13 | Ht 67.0 in | Wt 148.4 lb

## 2023-01-31 DIAGNOSIS — Z09 Encounter for follow-up examination after completed treatment for conditions other than malignant neoplasm: Secondary | ICD-10-CM

## 2023-01-31 DIAGNOSIS — D124 Benign neoplasm of descending colon: Secondary | ICD-10-CM

## 2023-01-31 DIAGNOSIS — Z86010 Personal history of colonic polyps: Secondary | ICD-10-CM | POA: Diagnosis not present

## 2023-01-31 MED ORDER — SODIUM CHLORIDE 0.9 % IV SOLN: 500.0000 mL | Freq: Once | INTRAVENOUS | Status: DC

## 2023-01-31 NOTE — Op Note (Signed)
[facility] Patient Name: Rebecca Ray Procedure Date: 01/31/2023 9:10 AM MRN: [id number] Endoscopist: Gatha Mayer , MD, [id number] Age: 67 Referring MD:  Date of Birth: [birth year] Gender: Female Account #: [id number] Procedure:                Colonoscopy Indications:              Surveillance: Personal history of adenomatous                            polyps on last colonoscopy > 5 years ago, Last                            colonoscopy: 2018 Medicines:                Monitored Anesthesia Care Procedure:                Pre-Anesthesia Assessment:                           - Prior to the procedure, a History and Physical                            was performed, and patient medications and                            allergies were reviewed. The patient's tolerance of                            previous anesthesia was also reviewed. The risks                            and benefits of the procedure and the sedation                            options and risks were discussed with the patient.                            All questions were answered, and informed consent                            was obtained. Prior Anticoagulants: The patient has                            taken no anticoagulant or antiplatelet agents. ASA                            Grade Assessment: II - A patient with mild systemic                            disease. After reviewing the risks and benefits,                            the patient was deemed in satisfactory condition to  undergo the procedure.                           After obtaining informed consent, the colonoscope                            was passed under direct vision. Throughout the                            procedure, the patient's blood pressure, pulse, and                            oxygen saturations were monitored continuously. The                            CF [id number] was introduced through the  anus                            and advanced to the the cecum, identified by                            appendiceal orifice and ileocecal valve. The                            colonoscopy was performed with difficulty due to                            significant looping. Successful completion of the                            procedure was aided by using manual pressure and                            withdrawing the scope and replacing with the adult                            colonoscope. The patient tolerated the procedure                            well. The quality of the bowel preparation was                            excellent. The ileocecal valve, appendiceal                            orifice, and rectum were photographed. The bowel                            preparation used was Miralax via split dose                            instruction. Scope In: 9:16:35 AM Scope Out: 9:53:08 AM Scope Withdrawal Time: 0 hours 6 minutes 18 seconds  Total Procedure Duration: 0 hours 36 minutes 33 seconds  Findings:                 The perianal and digital rectal examinations were                            normal.                           A diminutive polyp was found in the descending                            colon. The polyp was sessile. The polyp was removed                            with a cold snare. Resection and retrieval were                            complete. Verification of patient identification                            for the specimen was done. Estimated blood loss was                            minimal.                           Scattered diverticula were found in the entire                            colon.                           The exam was otherwise without abnormality on                            direct and retroflexion views. Complications:            No immediate complications. Estimated Blood Loss:     Estimated blood loss was minimal. Impression:                - One diminutive polyp in the descending colon,                            removed with a cold snare. Resected and retrieved.                           - Diverticulosis in the entire examined colon.                           - The examination was otherwise normal on direct                            and retroflexion views.                           - Personal history of colonic polyps. 09/2014 - 12  mm tub adenoma                           10/16/2017 no polyps Recommendation:           - Patient has a contact number available for                            emergencies. The signs and symptoms of potential                            delayed complications were discussed with the                            patient. Return to normal activities tomorrow.                            Written discharge instructions were provided to the                            patient.                           - Resume previous diet.                           - Continue present medications.                           - Await pathology results.                           - Repeat colonoscopy is recommended for                            surveillance. The colonoscopy date will be                            determined after pathology results from today's                            exam become available for review. USE ADULT SCOPE                            AND bilateral lower quadrant pressure Gatha Mayer, MD 01/31/2023 9:59:20 AM This report has been signed electronically.

## 2023-01-31 NOTE — Progress Notes (Signed)
Called to room to assist during endoscopic procedure.  Patient ID and intended procedure confirmed with present staff. Received instructions for my participation in the procedure from the performing physician.  

## 2023-01-31 NOTE — Progress Notes (Signed)
Uneventful anesthetic. Report to pacu rn. Vss. Care resumed by rn.

## 2023-01-31 NOTE — Progress Notes (Signed)
[facility] History and Physical   Primary Care Physician:  Eulas Post, MD   Reason for Procedure:   Hx colon polyps  Plan:    colonoscopy     HPI: Rebecca Ray is a 67 y.o. female s/p polypectomy   09/2014 - 12 mm tub adenoma  10/16/2017 no polyps  Past Medical History:  Diagnosis Date   ALLERGIC RHINITIS 10/03/2009   Allergy    History of colonic polyp - adenoma 09/07/2014   Osteoporosis    Seasonal allergies    UTI (urinary tract infection)     Past Surgical History:  Procedure Laterality Date   COLONOSCOPY     TMJ ARTHROPLASTY     TONSILLECTOMY     VARICOSE VEIN SURGERY      Prior to Admission medications   Medication Sig Start Date End Date Taking? Authorizing Provider  Acetaminophen (TYLENOL PO) Take by mouth.   Yes [provider]  alendronate (FOSAMAX) 70 MG tablet TAKE 1 TABLET(70 MG) BY MOUTH EVERY 7 DAYS WITH A FULL GLASS OF WATER AND ON AN EMPTY STOMACH 01/30/23  Yes Burchette, Alinda Sierras, MD  cetirizine (ZYRTEC) 10 MG tablet Take 10 mg by mouth daily.   Yes [provider]  ibuprofen (ADVIL) 200 MG tablet Take 200 mg by mouth every 6 (six) hours as needed.    [provider]    Current Outpatient Medications  Medication Sig Dispense Refill   Acetaminophen (TYLENOL PO) Take by mouth.     alendronate (FOSAMAX) 70 MG tablet TAKE 1 TABLET(70 MG) BY MOUTH EVERY 7 DAYS WITH A FULL GLASS OF WATER AND ON AN EMPTY STOMACH 4 tablet 5   cetirizine (ZYRTEC) 10 MG tablet Take 10 mg by mouth daily.     ibuprofen (ADVIL) 200 MG tablet Take 200 mg by mouth every 6 (six) hours as needed.     Current Facility-Administered Medications  Medication Dose Route Frequency Provider Last Rate Last Admin   0.9 %  sodium chloride infusion  500 mL Intravenous Once Gatha Mayer, MD        Allergies as of 01/31/2023   (No Known Allergies)    Family History  Problem Relation Age of Onset   Cirrhosis Mother         non-alcoholic cirrhosis   Atrial fibrillation Father    Arthritis Other    Hyperlipidemia Other    Stroke Other    Colon cancer Neg Hx    Colon polyps Neg Hx    Esophageal cancer Neg Hx    Rectal cancer Neg Hx    Stomach cancer Neg Hx     Social History   Socioeconomic History   Marital status: Married    Spouse name: Not on file   Number of children: Not on file   Years of education: Not on file   Highest education level: Not on file  Occupational History   Not on file  Tobacco Use   Smoking status: Never   Smokeless tobacco: Never  Vaping Use   Vaping Use: Never used  Substance and Sexual Activity   Alcohol use: Yes    Alcohol/week: 3.0 standard drinks of alcohol    Types: 3 Cans of beer per week   Drug use: No   Sexual activity: Not on file  Other Topics Concern   Not on file  Social History Narrative   Not on file   Social Determinants of Health   Financial Resource Strain: Not on  file  Food Insecurity: Not on file  Transportation Needs: Not on file  Physical Activity: Not on file  Stress: Not on file  Social Connections: Not on file  Intimate Partner Violence: Not on file    Review of Systems:  All other review of systems negative except as mentioned in the HPI.  Physical Exam: Vital signs BP 126/69   Pulse 93   Ht '5\' 7"'$  (1.702 m)   Wt 148 lb 6.4 oz (67.3 kg)   SpO2 98%   BMI 23.24 kg/m   General:   Alert,  Well-developed, well-nourished, pleasant and cooperative in NAD Lungs:  Clear throughout to auscultation.   Heart:  Regular rate and rhythm; no murmurs, clicks, rubs,  or gallops. Abdomen:  Soft, nontender and nondistended. Normal bowel sounds.   Neuro/Psych:  Alert and cooperative. Normal mood and affect. A and O x 3   '@Jazarah Capili'$  Simonne Maffucci, MD, [facility] [phone] (pager) 01/31/2023 9:08 AM@

## 2023-01-31 NOTE — Patient Instructions (Addendum)
I found and removed one tiny polyp and saw diverticulosis like before.  I will let you know pathology results and when to have another routine colonoscopy by mail and/or My Chart.  We had to press on the abdomen to get the scope around so could be more sore than in past - acetaminophen or ibuprofen or heating pad if needed.   I appreciate the opportunity to care for you. Gatha Mayer, MD, [facility]   Handouts on polyps and diverticulosis given to patient. Await pathology results. Resume previous diet and continue present medications.   YOU HAD AN ENDOSCOPIC PROCEDURE TODAY AT [facility]:   Refer to the procedure report that was given to you for any specific questions about what was found during the examination.  If the procedure report does not answer your questions, please call your gastroenterologist to clarify.  If you requested that your care partner not be given the details of your procedure findings, then the procedure report has been included in a sealed envelope for you to review at your convenience later.  YOU SHOULD EXPECT: Some feelings of bloating in the abdomen. Passage of more gas than usual.  Walking can help get rid of the air that was put into your GI tract during the procedure and reduce the bloating. If you had a lower endoscopy (such as a colonoscopy or flexible sigmoidoscopy) you may notice spotting of blood in your stool or on the toilet paper. If you underwent a bowel prep for your procedure, you may not have a normal bowel movement for a few days.  Please Note:  You might notice some irritation and congestion in your nose or some drainage.  This is from the oxygen used during your procedure.  There is no need for concern and it should clear up in a day or so.  SYMPTOMS TO REPORT IMMEDIATELY:  Following lower endoscopy (colonoscopy or flexible sigmoidoscopy):  Excessive amounts of blood in the stool  Significant tenderness or worsening of abdominal  pains  Swelling of the abdomen that is new, acute  Fever of 100F or higher  For urgent or emergent issues, a gastroenterologist can be reached at any hour by calling [phone]. Do not use MyChart messaging for urgent concerns.    DIET:  We do recommend a small meal at first, but then you may proceed to your regular diet.  Drink plenty of fluids but you should avoid alcoholic beverages for 24 hours.  ACTIVITY:  You should plan to take it easy for the rest of today and you should NOT DRIVE or use heavy machinery until tomorrow (because of the sedation medicines used during the test).    FOLLOW UP: Our staff will call the number listed on your records the next business day following your procedure.  We will call around 7:15- 8:00 am to check on you and address any questions or concerns that you may have regarding the information given to you following your procedure. If we do not reach you, we will leave a message.     If any biopsies were taken you will be contacted by phone or by letter within the next 1-3 weeks.  Please call us at [phone] if you have not heard about the biopsies in 3 weeks.    SIGNATURES/CONFIDENTIALITY: You and/or your care partner have signed paperwork which will be entered into your electronic medical record.  These signatures attest to the fact that that the information above on your After  Visit Summary has been reviewed and is understood.  Full responsibility of the confidentiality of this discharge information lies with you and/or your care-partner. 

## 2023-01-31 NOTE — Progress Notes (Signed)
Pt's states no medical or surgical changes since previsit or office visit. 

## 2023-02-01 ENCOUNTER — Telehealth: Payer: Self-pay | Admitting: *Deleted

## 2023-02-01 NOTE — Telephone Encounter (Signed)
  Follow up Call-     01/31/2023    8:14 AM  Call back number  Post procedure Call Back phone  # [phone]  Permission to leave phone message Yes     Patient questions:  Do you have a fever, pain , or abdominal swelling? No. Pain Score  0 *  Have you tolerated food without any problems? Yes.    Have you been able to return to your normal activities? Yes.    Do you have any questions about your discharge instructions: Diet   No. Medications  No. Follow up visit  No.  Do you have questions or concerns about your Care? No.  Actions: * If pain score is 4 or above: No action needed, pain <4.

## 2023-02-14 ENCOUNTER — Encounter: Payer: Self-pay | Admitting: Internal Medicine

## 2023-02-14 DIAGNOSIS — Z86010 Personal history of colonic polyps: Secondary | ICD-10-CM

## 2023-05-12 IMAGING — DX DG HAND COMPLETE 3+V*R*
3 series · 3 of 3 positions shown · non-contrast
Comparison: None.

CLINICAL DATA: Pain

EXAM:
RIGHT HAND - COMPLETE 3+ VIEW

[hand ap]
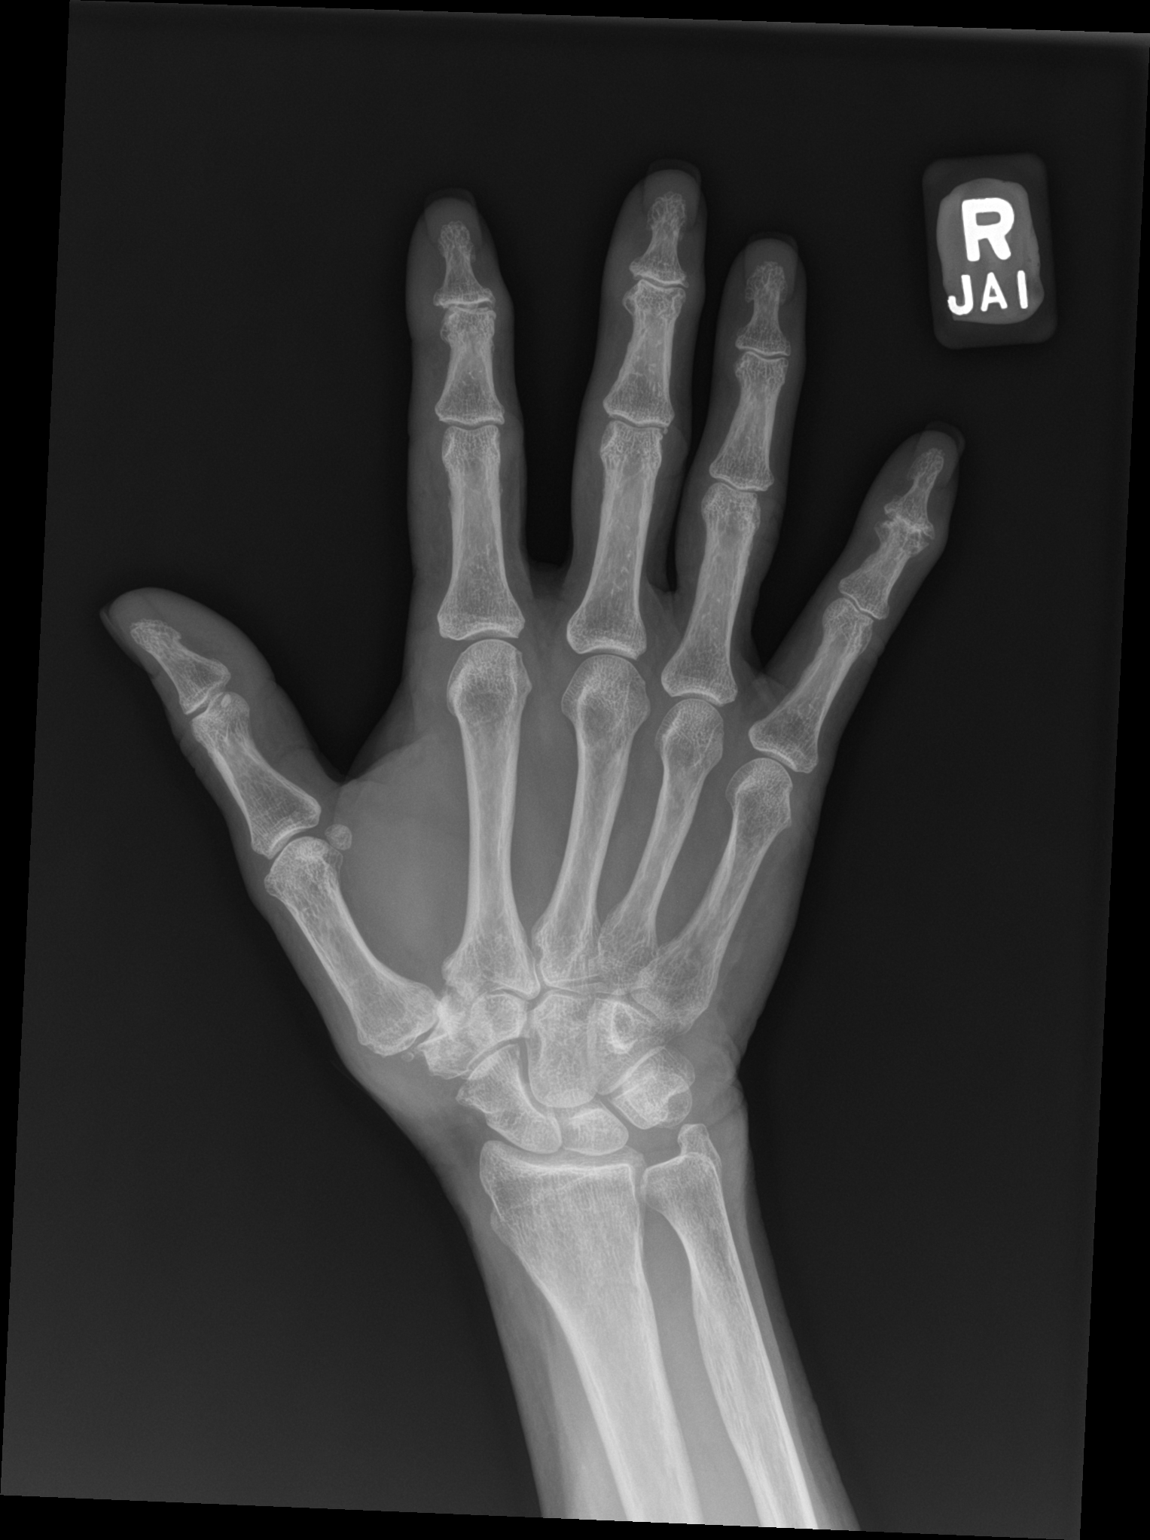

[hand obl]
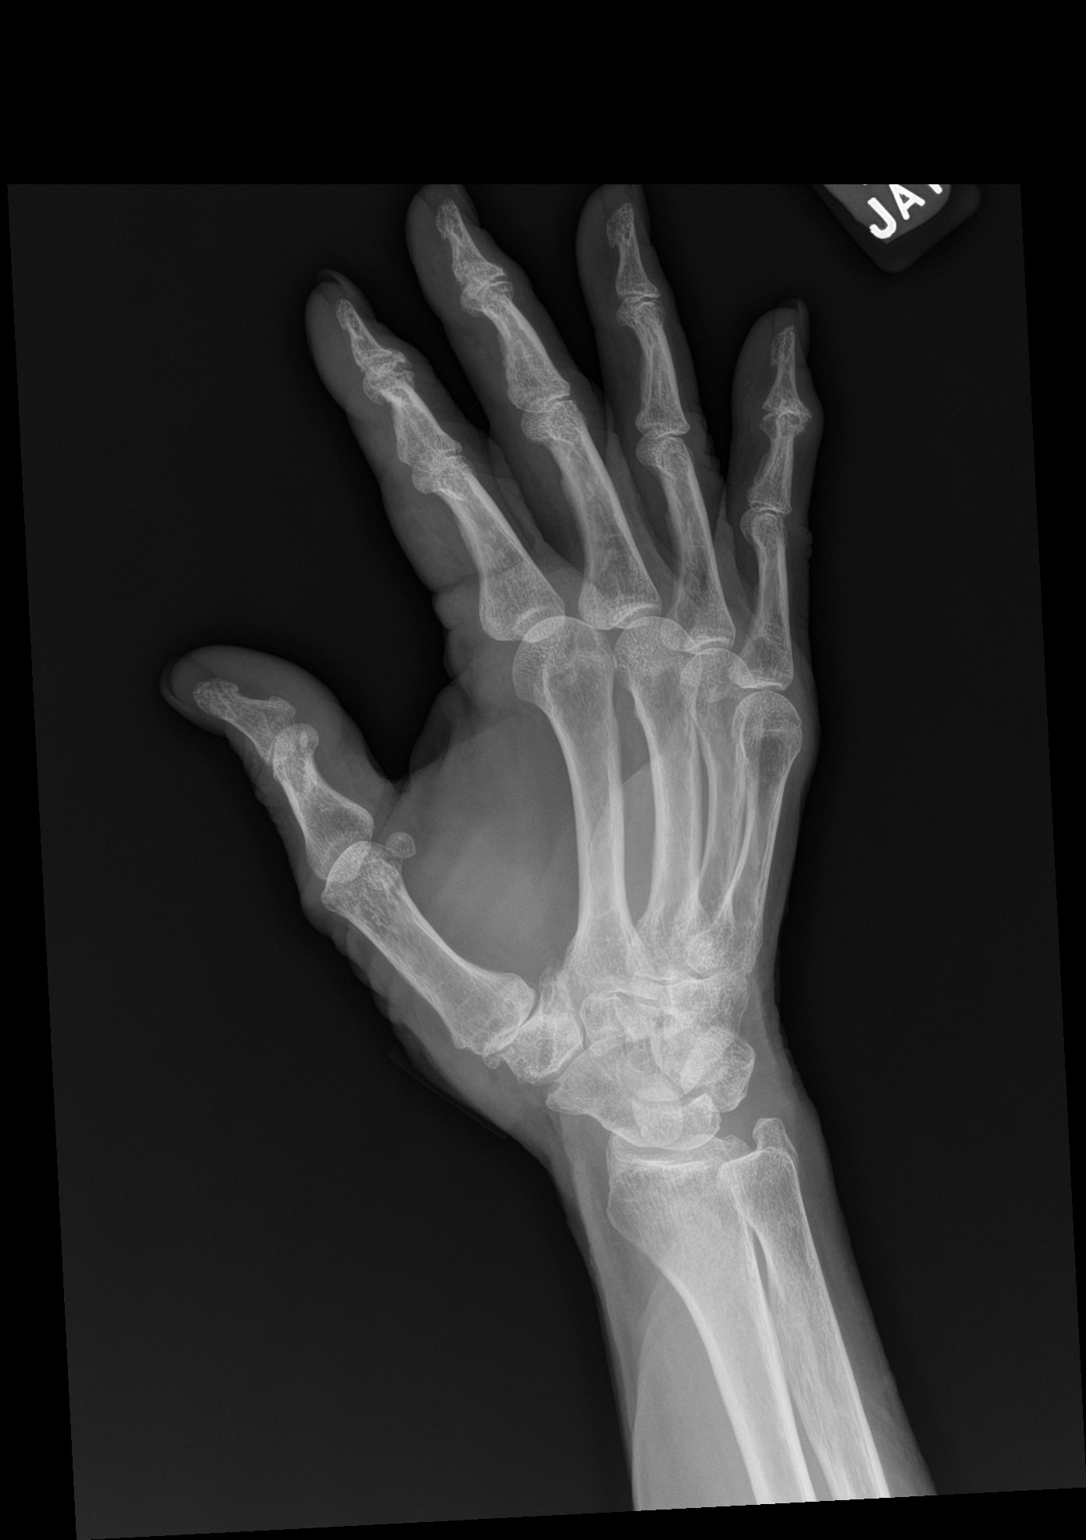

[hand lat]
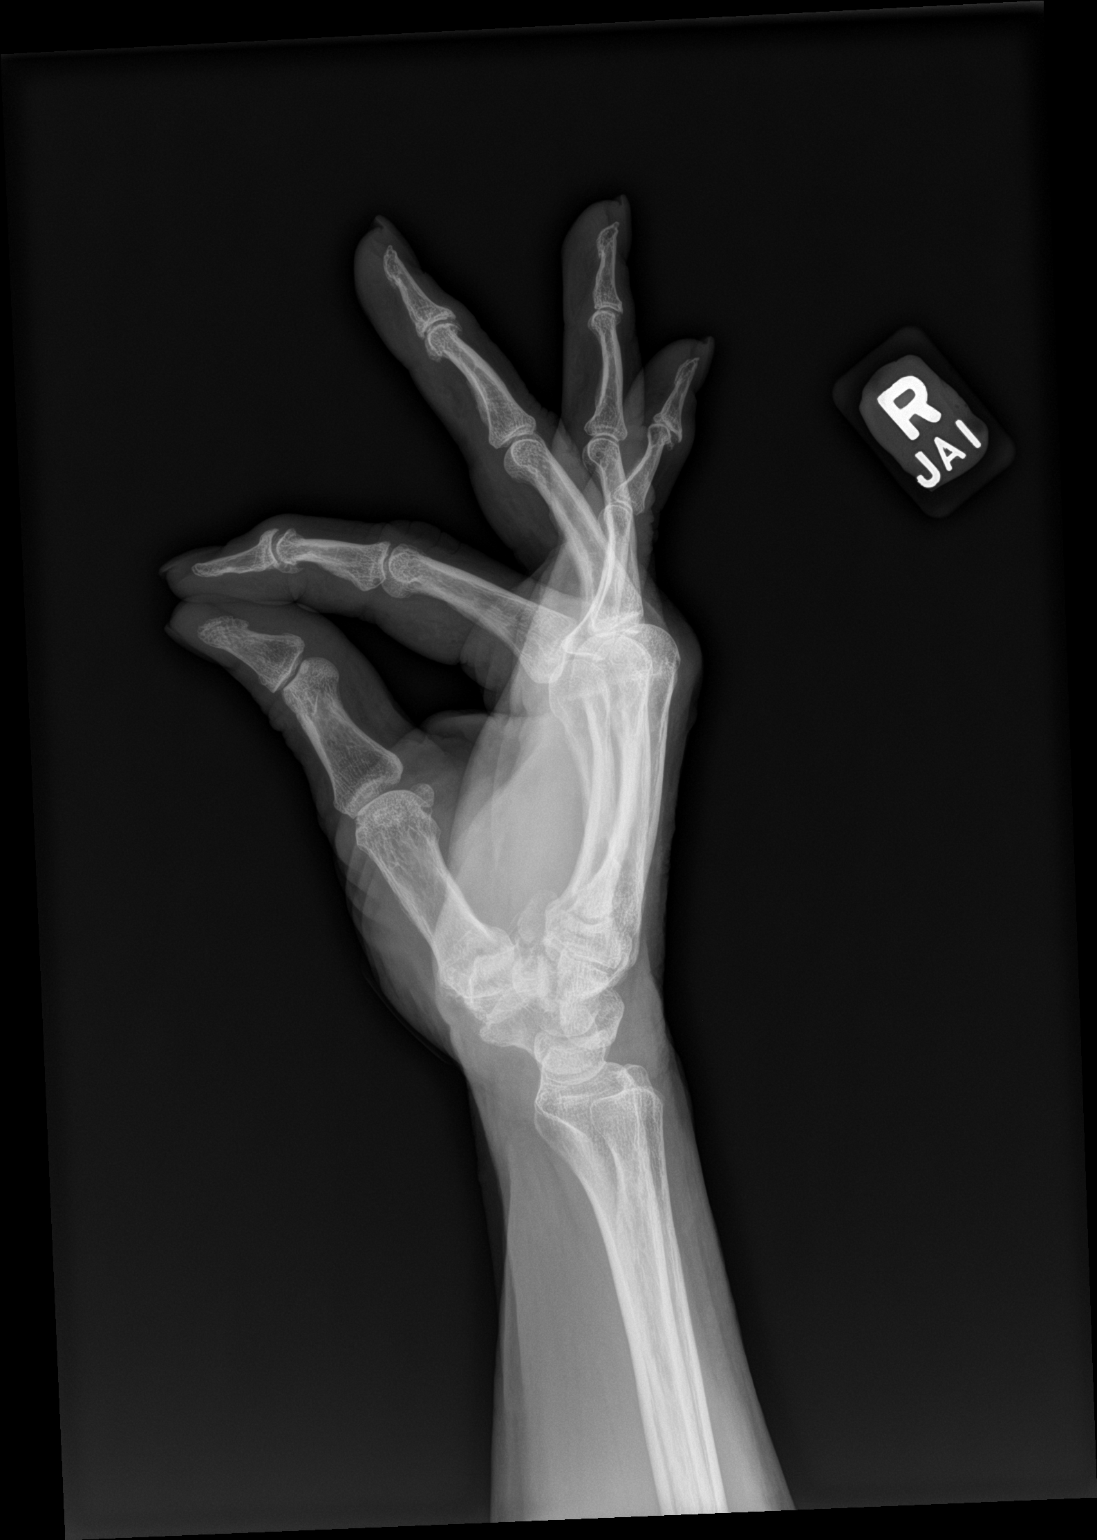

[3 of 3 positions shown; findings below may reference images not displayed]

FINDINGS: No recent fracture or dislocation is seen. Degenerative changes are
noted in the interphalangeal joints, more so in the distal
interphalangeal joint of the fifth finger. Degenerative changes are
noted in first carpometacarpal joint. Small bony spurs seen in the
first and second metacarpophalangeal joints.
IMPRESSION: No recent fracture or dislocation is seen in the right hand.
Degenerative changes are noted in multiple joints as described in
the body of the report.

## 2023-07-30 ENCOUNTER — Encounter: Payer: Self-pay | Admitting: Family Medicine

## 2023-07-30 ENCOUNTER — Ambulatory Visit (INDEPENDENT_AMBULATORY_CARE_PROVIDER_SITE_OTHER): Payer: Medicare Other | Admitting: Family Medicine

## 2023-07-30 VITALS — BP 124/76 | HR 75 | Temp 98.3°F | Ht 67.0 in | Wt 145.9 lb

## 2023-07-30 DIAGNOSIS — R55 Syncope and collapse: Secondary | ICD-10-CM | POA: Diagnosis not present

## 2023-07-30 DIAGNOSIS — R002 Palpitations: Secondary | ICD-10-CM

## 2023-07-30 NOTE — Progress Notes (Signed)
Established Patient Office Visit  Subjective   Patient ID: Rebecca Ray, female    DOB: 01/31/1956  Age: 67 y.o. MRN: [id number]  Chief Complaint  Patient presents with   Referral   Loss of Consciousness    Patient complains of syncope, x3 days     HPI   Rebecca Ray is seen following recent syncopal episode which occurred 3 days ago on Saturday.  She relates longstanding history of palpitations in the past especially triggered by things like caffeine and chocolate.  She recalls 1 day in July went with one of her daughters to a brewery and had 1 alcoholic drink and felt very dizzy afterwards but no syncope with that episode.  Couple weeks ago she recalls having significant palpitations after tea.  On Saturday they went to a winery.  They were sitting outside for several hours but she was sipping water and had a total of 2 glasses of wine over period of hours.  She did have some chocolate additionally and noticed some palpitations while at the winery.  No dyspnea.  No chest pains.  They drove home which was a 30-minute drive and she felt fine in the truck.  As soon as she got out of the truck she remembers feeling lightheadedness and this was followed by brief approximately 2 to 3-second episode of loss of consciousness.  She fell forward striking her face against the ground.  She had small laceration upper lip.  No neck injury.  No other injuries reported.  There was no suspicion for any seizure activity.  No recent headaches.  No cognitive changes.  She denied any associated nausea or vomiting.  No chest pains.  No prior episodes of syncope though she has had multiple prior episodes of palpitations as above triggered by things like stress, caffeine, alcohol, and chocolate.  She had event monitor last October which showed rare PVCs and PACs but no malignant arrhythmias.  She denies any recent exertional symptoms.  She stays very active and exercises fairly regularly.  No problems with exercise  intolerance.  Her only regular medication is Fosamax.  Past Medical History:  Diagnosis Date   ALLERGIC RHINITIS 10/03/2009   Allergy    History of colonic polyp - adenoma 09/07/2014   Osteoporosis    Seasonal allergies    UTI (urinary tract infection)    Past Surgical History:  Procedure Laterality Date   COLONOSCOPY     TMJ ARTHROPLASTY     TONSILLECTOMY     VARICOSE VEIN SURGERY      reports that she has never smoked. She has never used smokeless tobacco. She reports current alcohol use of about 3.0 standard drinks of alcohol per week. She reports that she does not use drugs. family history includes Arthritis in an other family member; Atrial fibrillation in her father; Cirrhosis in her mother; Hyperlipidemia in an other family member; Stroke in an other family member. No Known Allergies  Review of Systems  Constitutional:  Negative for fever and malaise/fatigue.  Eyes:  Negative for blurred vision.  Respiratory:  Negative for shortness of breath.   Cardiovascular:  Positive for palpitations. Negative for chest pain and leg swelling.  Neurological:  Positive for dizziness and loss of consciousness. Negative for seizures, weakness and headaches.      Objective:     BP 124/76 (BP Location: Left Arm, Patient Position: Sitting, Cuff Size: Normal)   Pulse 75   Temp 98.3 F (36.8 C) (Oral)   Ht 5\' 7"  (  1.702 m)   Wt 145 lb 14.4 oz (66.2 kg)   SpO2 98%   BMI 22.85 kg/m  BP Readings from Last 3 Encounters:  07/30/23 124/76  01/31/23 121/67  12/20/22 124/78   Wt Readings from Last 3 Encounters:  07/30/23 145 lb 14.4 oz (66.2 kg)  01/31/23 148 lb 6.4 oz (67.3 kg)  01/03/23 146 lb (66.2 kg)      Physical Exam Vitals reviewed.  Constitutional:      Appearance: She is well-developed.  Eyes:     Pupils: Pupils are equal, round, and reactive to light.  Neck:     Thyroid: No thyromegaly.     Vascular: No JVD.  Cardiovascular:     Rate and Rhythm: Normal rate and  regular rhythm.     Heart sounds: No murmur heard.    No gallop.  Pulmonary:     Effort: Pulmonary effort is normal. No respiratory distress.     Breath sounds: Normal breath sounds. No wheezing or rales.  Musculoskeletal:     Cervical back: Neck supple.     Right lower leg: No edema.     Left lower leg: No edema.  Neurological:     General: No focal deficit present.     Mental Status: She is alert.     Cranial Nerves: No cranial nerve deficit.     Motor: No weakness.      No results found for any visits on 07/30/23.    The 10-year ASCVD risk score (Arnett DK, et al., 2019) is: 5.3%    Assessment & Plan:   Problem List Items Addressed This Visit   None Visit Diagnoses     Syncope, unspecified syncope type    -  Primary   Relevant Orders   EKG 12-Lead     W presents with longstanding history of frequent palpitations with common triggers including stress, alcohol, caffeine, and chocolate.  Suspect these are related to symptomatic PVCs or PACs.  She does relate episode of syncope as above.  Blood pressure stable today and she has had no dizziness or concerning symptoms since then.  EKG today shows normal sinus rhythm with no acute ST-T changes.  She does not take any medications that would be lowering her blood pressure.  Event Saturday did follow being in the heat for several hours and couple glasses of wine although it sounds like she was hydrating also with water fairly well during that same time..  Previous cardiac home monitor revealed only rare PACs and PVCs with no malignant arrhythmias.  No clinical suspicion for seizure from recent event  -Continue to avoid chocolate, caffeine, alcohol -EKG as above with no concerning findings today -Patient will likely need echo and further cardiac evaluation. -We elected to go ahead and set up cardiology referral -Check labs today with CBC, CMP, and magnesium level -Follow-up immediately for any recurrent dizziness or syncopal  episodes  No follow-ups on file.    [name], MD

## 2023-07-30 NOTE — Patient Instructions (Signed)
I will be setting up the cardiology referral.    Avoid caffeine, chocolate, and alcohol for now.

## 2023-07-31 LAB — COMPREHENSIVE METABOLIC PANEL
ALT: 19 U/L (ref 0–35)
AST: 26 U/L (ref 0–37)
Albumin: 4.5 g/dL (ref 3.5–5.2)
Alkaline Phosphatase: 50 U/L (ref 39–117)
BUN: 12 mg/dL (ref 6–23)
CO2: 29 meq/L (ref 19–32)
Calcium: 10.5 mg/dL (ref 8.4–10.5)
Chloride: 100 meq/L (ref 96–112)
Creatinine, Ser: 0.69 mg/dL (ref 0.40–1.20)
GFR: 90.12 mL/min (ref >60.00)
Glucose, Bld: 88 mg/dL (ref 70–99)
Potassium: 4.3 meq/L (ref 3.5–5.1)
Sodium: 139 meq/L (ref 135–145)
Total Bilirubin: 0.3 mg/dL (ref 0.2–1.2)
Total Protein: 7.2 g/dL (ref 6.0–8.3)

## 2023-07-31 LAB — CBC WITH DIFFERENTIAL/PLATELET
Basophils Absolute: 0.1 10*3/uL (ref 0.0–0.1)
Basophils Relative: 1 % (ref 0.0–3.0)
Eosinophils Absolute: 0 10*3/uL (ref 0.0–0.7)
Eosinophils Relative: 0.6 % (ref 0.0–5.0)
HCT: 44 % (ref 36.0–46.0)
Hemoglobin: 14.6 g/dL (ref 12.0–15.0)
Lymphocytes Relative: 30.7 % (ref 12.0–46.0)
Lymphs Abs: 1.7 10*3/uL (ref 0.7–4.0)
MCHC: 33.1 g/dL (ref 30.0–36.0)
MCV: 96 fl (ref 78.0–100.0)
Monocytes Absolute: 0.5 10*3/uL (ref 0.1–1.0)
Monocytes Relative: 9.1 % (ref 3.0–12.0)
Neutro Abs: 3.2 10*3/uL (ref 1.4–7.7)
Neutrophils Relative %: 58.6 % (ref 43.0–77.0)
Platelets: 250 10*3/uL (ref 150.0–400.0)
RBC: 4.58 Mil/uL (ref 3.87–5.11)
RDW: 13.2 % (ref 11.5–15.5)
WBC: 5.5 10*3/uL (ref 4.0–10.5)

## 2023-07-31 LAB — MAGNESIUM: Magnesium: 2.3 mg/dL (ref 1.5–2.5)

## 2023-08-01 ENCOUNTER — Other Ambulatory Visit: Payer: Self-pay | Admitting: Family Medicine

## 2023-08-07 ENCOUNTER — Ambulatory Visit (HOSPITAL_BASED_OUTPATIENT_CLINIC_OR_DEPARTMENT_OTHER): Payer: Medicare Other | Admitting: Cardiovascular Disease

## 2023-08-07 ENCOUNTER — Encounter (HOSPITAL_BASED_OUTPATIENT_CLINIC_OR_DEPARTMENT_OTHER): Payer: Self-pay | Admitting: Cardiovascular Disease

## 2023-08-07 VITALS — BP 109/75 | HR 86 | Ht 67.0 in | Wt 145.0 lb

## 2023-08-07 DIAGNOSIS — R002 Palpitations: Secondary | ICD-10-CM | POA: Diagnosis not present

## 2023-08-07 DIAGNOSIS — R55 Syncope and collapse: Secondary | ICD-10-CM | POA: Diagnosis not present

## 2023-08-07 DIAGNOSIS — E785 Hyperlipidemia, unspecified: Secondary | ICD-10-CM

## 2023-08-07 DIAGNOSIS — I10 Essential (primary) hypertension: Secondary | ICD-10-CM | POA: Diagnosis not present

## 2023-08-07 HISTORY — DX: Palpitations: R00.2

## 2023-08-07 HISTORY — DX: Syncope and collapse: R55

## 2023-08-07 NOTE — Patient Instructions (Signed)
Medication Instructions:  Your physician recommends that you continue on your current medications as directed. Please refer to the Current Medication list given to you today.  *If you need a refill on your cardiac medications before your next appointment, please call your pharmacy*  Lab Work: TSH TODAY   If you have labs (blood work) drawn today and your tests are completely normal, you will receive your results only by: MyChart Message (if you have MyChart) OR A paper copy in the mail If you have any lab test that is abnormal or we need to change your treatment, we will call you to review the results.  Testing/Procedures: CALCIUM SCORE - THIS WILL COST YOU $99 OUT OF POCKET   Your physician has requested that you have an echocardiogram. Echocardiography is a painless test that uses sound waves to create images of your heart. It provides your doctor with information about the size and shape of your heart and how well your heart's chambers and valves are working. This procedure takes approximately one hour. There are no restrictions for this procedure. Please do NOT wear cologne, perfume, aftershave, or lotions (deodorant is allowed). Please arrive 15 minutes prior to your appointment time.   Follow-Up: At [facility], you and your health needs are our priority.  As part of our continuing mission to provide you with exceptional heart care, we have created designated Provider Care Teams.  These Care Teams include your primary Cardiologist (physician) and Advanced Practice Providers (APPs -  Physician Assistants and Nurse Practitioners) who all work together to provide you with the care you need, when you need it.  We recommend signing up for the patient portal called "MyChart".  Sign up information is provided on this After Visit Summary.  MyChart is used to connect with patients for Virtual Visits (Telemedicine).  Patients are able to view lab/test results, encounter notes, upcoming  appointments, etc.  Non-urgent messages can be sent to your provider as well.   To learn more about what you can do with MyChart, go to [email or web address].    Your next appointment:   3 month(s)  Provider:   Gillian Shields, NP    FOLLOW UP WITH YOUR GASTROENTEROLOGIST

## 2023-08-07 NOTE — Progress Notes (Signed)
Urinary Cardiology Office Note:  .   Date:  08/07/2023  ID:  Rebecca Ray, DOB 1956/11/21, MRN [id number] PCP: Kristian Covey, MD  [facility] Providers Cardiologist:  None    History of Present Illness: .   Rebecca Ray is a 67 y.o. female with hyperlipidemia who is being seen today for the evaluation of syncope at the request of Burchette, Elberta Fortis, MD.  [name] presents with a two-year history of episodic palpitations and near syncope. The episodes are often triggered by certain foods or drinks, particularly those high in sugar or caffeine. The patient describes the palpitations as a hard pounding sensation in the chest, sometimes lasting up to six hours. Accompanying symptoms include a sensation of impending blackout, visual disturbances (black and white flashes), and a feeling of clamminess.  The most severe episode occurred recently when the patient experienced a syncopal event, described as a sudden collapse, akin to being tased, followed by repetitive movements and verbalizations. The patient did not report any chest pain, shortness of breath, or swelling in the legs or feet. There were no symptoms suggestive of acid reflux or heartburn. The patient denies any smoking history.  The patient's symptoms appear to be increasing in frequency, which is a cause for concern given the family history of heart disease. The patient's father and grandmother both had pacemakers, and several younger relatives have been diagnosed with atrial fibrillation.  The patient has been monitoring their heart rate at home using a cardiomobile device, which has shown episodes of sinus tachycardia during symptomatic periods. The patient also reports occasional difficulty swallowing certain foods, such as bread or potatoes, which may suggest an esophageal issue.  The patient has not been exercising regularly but denies any exertional symptoms. They have lost some weight recently and have been trying to  maintain a healthy diet. The patient's blood pressure and cholesterol levels have been borderline high in the past, but recent measurements have shown improvement.  She saw Dr. Caryl Never 07/2023 and reported recurrent syncopal episodes.  She also has a history of palpitations.  EKG at the time revealed sinus rhythm with no acute abnormalities.  She wore an ambulatory monitor 09/2022 that revealed sinus rhythm with less than 1% PACs and PVCs.  She was referred to cardiology for further evaluation.  ROS:  As per HPI  Studies Reviewed: Lourena Simmonds mobile: Episodes of sinus rhythm and sinus tachycardia.  Rate 110-120s.   Ambulatory monitor 09/2022:   Patient had a minimum heart rate of 57 bpm, maximum heart rate of 135 bpm, and average heart rate of 85 bpm.   Predominant underlying rhythm was sinus rhythm.   Isolated PACs were rare (<1.0%).   Isolated PVCs were rare (<1.0%).   Triggered event associated with sinus rhythm.   No malignant arrhythmias.  Risk Assessment/Calculations:             Physical Exam:   VS:  BP 109/75 (BP Location: Left Arm, Patient Position: Sitting, Cuff Size: Normal)   Pulse 86   Ht 5\' 7"  (1.702 m)   Wt 145 lb (65.8 kg)   SpO2 95%   BMI 22.71 kg/m  , BMI Body mass index is 22.71 kg/m. GENERAL:  Well appearing HEENT: Pupils equal round and reactive, fundi not visualized, oral mucosa unremarkable NECK:  No jugular venous distention, waveform within normal limits, carotid upstroke brisk and symmetric, no bruits, no thyromegaly LUNGS:  Clear to auscultation bilaterally HEART:  RRR.  PMI not displaced or sustained,S1 and S2 within normal limits, no S3, no S4, no clicks, no rubs, no murmurs ABD:  Flat, positive bowel sounds normal in frequency in pitch, no bruits, no rebound, no guarding, no midline pulsatile mass, no hepatomegaly, no splenomegaly EXT:  2 plus pulses throughout, no edema, no cyanosis no clubbing SKIN:  No rashes no nodules NEURO:  Cranial  nerves II through XII grossly intact, motor grossly intact throughout PSYCH:  Cognitively intact, oriented to person place and time   ASSESSMENT AND PLAN: .    # Palpitations and Presyncope # PAC/PVCs: Episodes of palpitations and near syncope, possibly related to certain food and drink triggers. No history of passing out. Family history of arrhythmias and pacemakers. EKG and Kardia mobile showed sinus tachycardia during an event. -Order echocardiogram to assess cardiac structure. -Check thyroid stimulating hormone (TSH) levels.  Other labs are reassuring -Refer to gastroenterology for possible esophageal irritation. -Advise patient to avoid known triggers and to sit or lay down with feet elevated if feeling lightheaded. - no beta blockers given hypotension.  Treat underlying cause of sinus tachycardia  # Hyperlipidemia History of elevated cholesterol levels in the past, improved in recent years. -Check lipid panel. -Coronary calcium score for further risk stratification.         Dispo: Follow-up in 3 months with nurse practitioner.  Signed, Chilton Si, MD '

## 2023-08-08 LAB — LIPID PANEL
Chol/HDL Ratio: 3.2 ratio (ref 0.0–4.4)
Cholesterol, Total: 288 mg/dL — ABNORMAL HIGH (ref 100–199)
HDL: 89 mg/dL (ref >39)
LDL Chol Calc (NIH): 189 mg/dL — ABNORMAL HIGH (ref 0–99)
Triglycerides: 66 mg/dL (ref 0–149)
VLDL Cholesterol Cal: 10 mg/dL (ref 5–40)

## 2023-08-08 LAB — TSH: TSH: 4.17 u[IU]/mL (ref 0.450–4.500)

## 2023-08-27 ENCOUNTER — Ambulatory Visit (HOSPITAL_BASED_OUTPATIENT_CLINIC_OR_DEPARTMENT_OTHER): Payer: Medicare Other

## 2023-08-27 DIAGNOSIS — R55 Syncope and collapse: Secondary | ICD-10-CM

## 2023-08-27 LAB — ECHOCARDIOGRAM COMPLETE
Area-P 1/2: 3.48 cm2
S' Lateral: 1.74 cm

## 2023-09-02 ENCOUNTER — Ambulatory Visit (HOSPITAL_BASED_OUTPATIENT_CLINIC_OR_DEPARTMENT_OTHER)
Admission: RE | Admit: 2023-09-02 | Discharge: 2023-09-02 | Disposition: A | Discharge status: Home / Self Care | Payer: Medicare Other | Source: Ambulatory Visit | Attending: Cardiovascular Disease | Admitting: Cardiovascular Disease

## 2023-09-02 DIAGNOSIS — E785 Hyperlipidemia, unspecified: Secondary | ICD-10-CM | POA: Insufficient documentation

## 2023-09-06 ENCOUNTER — Telehealth (HOSPITAL_BASED_OUTPATIENT_CLINIC_OR_DEPARTMENT_OTHER): Payer: Self-pay

## 2023-09-06 DIAGNOSIS — E785 Hyperlipidemia, unspecified: Secondary | ICD-10-CM

## 2023-09-06 NOTE — Addendum Note (Signed)
Addended by: Marlene Lard on: 09/06/2023 09:16 AM   Modules accepted: Orders

## 2023-09-06 NOTE — Telephone Encounter (Addendum)
Seen by patient Rebecca Ray on 09/05/2023  8:56 PM; follow up mychart message sent to patient.    ----- Message from Alver Sorrow sent at 09/05/2023  8:40 PM EDT ----- Normal thyroid. Cholesterol elevated. Given coronary calcification noted on CT, recommend initiation of Rosuvastatin 20mg  daily with repeat FLP/LFT in 2 months.

## 2023-09-23 DIAGNOSIS — C44612 Basal cell carcinoma of skin of right upper limb, including shoulder: Secondary | ICD-10-CM | POA: Diagnosis not present

## 2023-09-23 DIAGNOSIS — D485 Neoplasm of uncertain behavior of skin: Secondary | ICD-10-CM | POA: Diagnosis not present

## 2023-09-23 DIAGNOSIS — L821 Other seborrheic keratosis: Secondary | ICD-10-CM | POA: Diagnosis not present

## 2023-09-23 DIAGNOSIS — Z85828 Personal history of other malignant neoplasm of skin: Secondary | ICD-10-CM | POA: Diagnosis not present

## 2023-09-23 DIAGNOSIS — D2272 Melanocytic nevi of left lower limb, including hip: Secondary | ICD-10-CM | POA: Diagnosis not present

## 2023-09-23 DIAGNOSIS — L570 Actinic keratosis: Secondary | ICD-10-CM | POA: Diagnosis not present

## 2023-09-23 DIAGNOSIS — D2239 Melanocytic nevi of other parts of face: Secondary | ICD-10-CM | POA: Diagnosis not present

## 2023-09-23 DIAGNOSIS — D224 Melanocytic nevi of scalp and neck: Secondary | ICD-10-CM | POA: Diagnosis not present

## 2023-09-23 DIAGNOSIS — C44519 Basal cell carcinoma of skin of other part of trunk: Secondary | ICD-10-CM | POA: Diagnosis not present

## 2023-09-23 DIAGNOSIS — D2271 Melanocytic nevi of right lower limb, including hip: Secondary | ICD-10-CM | POA: Diagnosis not present

## 2023-09-23 DIAGNOSIS — L738 Other specified follicular disorders: Secondary | ICD-10-CM | POA: Diagnosis not present

## 2023-09-26 ENCOUNTER — Ambulatory Visit: Payer: Medicare Other | Admitting: Family Medicine

## 2023-09-26 ENCOUNTER — Other Ambulatory Visit: Payer: Self-pay

## 2023-09-26 VITALS — BP 128/78 | HR 82 | Ht 67.0 in | Wt 142.0 lb

## 2023-09-26 DIAGNOSIS — G8929 Other chronic pain: Secondary | ICD-10-CM

## 2023-09-26 DIAGNOSIS — M79641 Pain in right hand: Secondary | ICD-10-CM | POA: Diagnosis not present

## 2023-09-26 DIAGNOSIS — M1811 Unilateral primary osteoarthritis of first carpometacarpal joint, right hand: Secondary | ICD-10-CM

## 2023-09-26 NOTE — Patient Instructions (Addendum)
Thank you for coming in today.   You received an injection today. Seek immediate medical attention if the joint becomes red, extremely painful, or is oozing fluid.  

## 2023-09-26 NOTE — Progress Notes (Signed)
   Rubin Payor, PhD, LAT, ATC acting as a scribe for Clementeen Graham, MD.  Rebecca Ray is a 67 y.o. female who presents to [facility] at [facility] today for cont'd R thumb pain. Pt was last seen by Dr. Denyse Amass on 12/20/22 and was given a R 1st CMC steroid injection.  Today, pt reports R thumb pain got back again late-July-Aug. Pain w/ gripping and unable to use a can opener. Pt locates pain to the radial aspect of the 1st [facility] and into 1st MCP joint.   Dx imaging: 12/06/21 R hand XR   Pertinent review of systems: No fevers or chills  Relevant historical information: Right hand arthritis   Exam:  BP 128/78   Pulse 82   Ht 5\' 7"  (1.702 m)   Wt 142 lb (64.4 kg)   SpO2 98%   BMI 22.24 kg/m  General: Well Developed, well nourished, and in no acute distress.   MSK: Right hand bossing at first [facility].  Tender palpation.    Lab and Radiology Results  Procedure: Real-time Ultrasound Guided Injection of right first [facility] Device: Philips Affiniti 50G/GE Logiq Images permanently stored and available for review in PACS Verbal informed consent obtained.  Discussed risks and benefits of procedure. Warned about infection, bleeding, hyperglycemia damage to structures among others. Patient expresses understanding and agreement Time-out conducted.   Noted no overlying erythema, induration, or other signs of local infection.   Skin prepped in a sterile fashion.   Local anesthesia: Topical Ethyl chloride.   With sterile technique and under real time ultrasound guidance: 20 mg of Kenalog and 0.5 mg of lidocaine injected into first CMC. Fluid seen entering the joint capsule.   Completed without difficulty   Pain immediately resolved suggesting accurate placement of the medication.   Advised to call if fevers/chills, erythema, induration, drainage, or persistent bleeding.   Images permanently stored and available for review in the ultrasound unit.  Impression: Technically  successful ultrasound guided injection.       Assessment and Plan: 67 y.o. female with right hand arthritis at first [facility].  Last injection was about 9 months ago.  Plan for repeat injection today.  Continue hand therapy exercises and Voltaren gel.  Check back as needed.   PDMP not reviewed this encounter. Orders Placed This Encounter  Procedures   [place] LIMITED JOINT SPACE STRUCTURES LOW RIGHT(NO LINKED CHARGES)    Order Specific Question:   Reason for Exam (SYMPTOM  OR DIAGNOSIS REQUIRED)    Answer:   right thumb pain    Order Specific Question:   Preferred imaging location?    Answer:   [facility]   No orders of the defined types were placed in this encounter.    Discussed warning signs or symptoms. Please see discharge instructions. Patient expresses understanding.   The above documentation has been reviewed and is accurate and complete Clementeen Graham, M.D.

## 2023-10-22 ENCOUNTER — Other Ambulatory Visit: Payer: Self-pay | Admitting: Family Medicine

## 2023-11-05 DIAGNOSIS — E785 Hyperlipidemia, unspecified: Secondary | ICD-10-CM | POA: Diagnosis not present

## 2023-11-06 LAB — LIPID PANEL
Chol/HDL Ratio: 2.7 {ratio} (ref 0.0–4.4)
Cholesterol, Total: 298 mg/dL — ABNORMAL HIGH (ref 100–199)
HDL: 110 mg/dL (ref >39)
LDL Chol Calc (NIH): 178 mg/dL — ABNORMAL HIGH (ref 0–99)
Triglycerides: 67 mg/dL (ref 0–149)
VLDL Cholesterol Cal: 10 mg/dL (ref 5–40)

## 2023-11-06 LAB — HEPATIC FUNCTION PANEL
ALT: 15 [IU]/L (ref 0–32)
AST: 22 [IU]/L (ref 0–40)
Albumin: 4.7 g/dL (ref 3.9–4.9)
Alkaline Phosphatase: 72 [IU]/L (ref 44–121)
Bilirubin Total: 0.4 mg/dL (ref 0.0–1.2)
Bilirubin, Direct: 0.13 mg/dL (ref 0.00–0.40)
Total Protein: 6.9 g/dL (ref 6.0–8.5)

## 2023-11-07 ENCOUNTER — Encounter: Payer: Self-pay | Admitting: Internal Medicine

## 2023-11-07 ENCOUNTER — Ambulatory Visit: Payer: Medicare Other | Admitting: Internal Medicine

## 2023-11-07 VITALS — BP 112/70 | HR 78 | Ht 67.0 in | Wt 142.0 lb

## 2023-11-07 DIAGNOSIS — R1319 Other dysphagia: Secondary | ICD-10-CM

## 2023-11-07 DIAGNOSIS — R1310 Dysphagia, unspecified: Secondary | ICD-10-CM

## 2023-11-07 NOTE — Progress Notes (Signed)
Chief Complaint: esophageal dysphagia  [id number] is 67 year old female patient with medical history of osteoporosis, hyperlipidemia, seasonal allergies, and history of colonic polyp who was referred to me by Cardiologist Dr. Chilton Si for evaluation of possible esophageal irritation.  On 07/30/2023 she saw her PCP Dr. Caryl Never 07/2023 for recurrent syncopal episodes and history of palpitations.  EKG at the time revealed sinus rhythm with no acute abnormalities.  She wore an ambulatory monitor 09/2022 that revealed SR  with less than 1% PACs and PVCs.  She was referred to cardiology for further evaluation.   On 08/07/2023 Patient seen by cardiology for evaluation of syncope episode and history of episodic palpitations. The episodes are often triggered by certain foods and drinks, particularly those high in sugar or caffeine. EKG and Kardia mobile showed sinus tachycardia during an event. They ordered echo (EF 60 to 65%), TSH level (4.170) , and referral for gastroenterology for possible esophageal irritation.  Interval history:   Patient also reports esophageal dysphagia she has had for several years that has progressed over the last 2-3 years. She reports dysphagia with solids only (ex: Bread and potatoes). She chews into small pieces and takes her time eating with chin tuck maneuver. She also describes a tightness in chest most of the time while she eats that passes over time. She reports it feels as if the food is moving down the esophagus slowly. Denies GERD. Denies nausea, vomiting, or weight loss. Denies altered bowel habits,abdominal pain, or blood in stool. She reports her father had similar issue that required EGD with dilatation multiple times.   Since patients cardiology for appointment she has cut out caffeine, granola, chocolate, and tea. This helps with the heart palpitation episodes. She drinks wine socially which does not cause heart palpitations.  Wt Readings from Last 3  Encounters:  11/07/23 142 lb (64.4 kg)  09/26/23 142 lb (64.4 kg)  08/07/23 145 lb (65.8 kg)   Past Medical History:  Diagnosis Date   ALLERGIC RHINITIS 10/03/2009   Allergy    History of colonic polyp - adenoma 09/07/2014   Near syncope 08/07/2023   Osteoporosis    Palpitations 08/07/2023   Seasonal allergies    Syncope    UTI (urinary tract infection)    Past Surgical History:  Procedure Laterality Date   COLONOSCOPY     TMJ ARTHROPLASTY     TONSILLECTOMY     VARICOSE VEIN SURGERY     Current Outpatient Medications  Medication Sig Dispense Refill   Acetaminophen (TYLENOL PO) Take by mouth.     alendronate (FOSAMAX) 70 MG tablet TAKE 1 TABLET(70 MG) BY MOUTH EVERY 7 DAYS WITH A FULL GLASS OF WATER AND ON AN EMPTY STOMACH 4 tablet 3   b complex vitamins capsule Take 1 capsule by mouth daily.     cetirizine (ZYRTEC) 10 MG tablet Take 10 mg by mouth daily.     ibuprofen (ADVIL) 200 MG tablet Take 200 mg by mouth every 6 (six) hours as needed.     KRILL OIL PO Take 1 capsule by mouth daily.     Multiple Vitamin (MULTIVITAMIN WITH MINERALS) TABS tablet Take 1 tablet by mouth daily.     Multiple Vitamins-Minerals (ZINC PO) Take 1 tablet by mouth daily.     QUERCETIN PO Take 1 tablet by mouth daily.     No current facility-administered medications for this visit.   Allergies as of 11/07/2023   (No Known Allergies)   Family History  Problem Relation Age of Onset   Cirrhosis Mother        non-alcoholic cirrhosis   Atrial fibrillation Father    Atrial fibrillation Paternal Grandmother    Arthritis Other    Hyperlipidemia Other    Stroke Other    Colon cancer Neg Hx    Colon polyps Neg Hx    Esophageal cancer Neg Hx    Rectal cancer Neg Hx    Stomach cancer Neg Hx    Review of Systems:    Constitutional: No weight loss, fever, chills, weakness or fatigue HEENT: Eyes: No change in vision               Ears, Nose, Throat:  No change in hearing or congestion Skin: No  rash or itching Cardiovascular: No chest pain   Respiratory: No SOB or cough Gastrointestinal: See HPI and otherwise negative Genitourinary: No dysuria or change in urinary frequency Neurological: No headache, dizziness or syncope Musculoskeletal: No new muscle or joint pain Hematologic: No bleeding or bruising Psychiatric: No history of depression or anxiety   Physical Exam:  Vital signs: BP 112/70   Pulse 78   Ht 5\' 7"  (1.702 m)   Wt 142 lb (64.4 kg)   BMI 22.24 kg/m   Constitutional:   Pleasant Caucasian female appears to be in NAD, Well developed, Well nourished, alert and cooperative Throat: Oral cavity and pharynx without inflammation, swelling or lesion.  Respiratory: Respirations even and unlabored. Lungs clear to auscultation bilaterally.   No wheezes, crackles, or rhonchi.  Cardiovascular: Normal S1, S2. Regular rate and rhythm. No peripheral edema, cyanosis or pallor.  Gastrointestinal:  Soft, nondistended, nontender. No rebound or guarding. Normal bowel sounds. No appreciable masses or hepatomegaly. Rectal:  Not performed.  Skin:   Dry and intact without significant lesions or rashes. Psychiatric: Oriented to person, place and time. Demonstrates good judgement and reason without abnormal affect or behaviors.  RELEVANT LABS AND IMAGING: CBC    Component Value Date/Time   WBC 5.5 07/30/2023 1528   RBC 4.58 07/30/2023 1528   HGB 14.6 07/30/2023 1528   HCT 44.0 07/30/2023 1528   PLT 250.0 07/30/2023 1528   MCV 96.0 07/30/2023 1528   MCHC 33.1 07/30/2023 1528   RDW 13.2 07/30/2023 1528   LYMPHSABS 1.7 07/30/2023 1528   MONOABS 0.5 07/30/2023 1528   EOSABS 0.0 07/30/2023 1528   BASOSABS 0.1 07/30/2023 1528   CMP     Component Value Date/Time   NA 139 07/30/2023 1528   K 4.3 07/30/2023 1528   CL 100 07/30/2023 1528   CO2 29 07/30/2023 1528   GLUCOSE 88 07/30/2023 1528   BUN 12 07/30/2023 1528   CREATININE 0.69 07/30/2023 1528   CALCIUM 10.5 07/30/2023 1528    PROT 6.9 11/05/2023 0835   ALBUMIN 4.7 11/05/2023 0835   AST 22 11/05/2023 0835   ALT 15 11/05/2023 0835   ALKPHOS 72 11/05/2023 0835   BILITOT 0.4 11/05/2023 0835   08/07/2023 labs show: TSH 4.170, cholesterol 288, LDL 189 08/27/2023 Echo - Left ventricular ejection fraction, by estimation, is 60 to 65%.  01/31/2023 Colonoscopy Impression:  - One diminutive polyp in the descending colon, removed with a cold snare. Resected and retrieved. - Diverticulosis in the entire examined colon. - The examination was otherwise normal on direct and retroflexion views. - Personal history of colonic polyps. 10/ 2015 - 12 mm tub adenoma 11/ 14/ 2018 no polyps   Assessment: 67 year old female patient who presents  with chronic esophageal dysphagia that has worsened in frequency and intensity the past 2-3 years. No typical symptoms of GERD. Family hx of esophageal issues requiring dilatations. Will schedule endoscopy evaluate and treat dysphagia.  Encounter Diagnosis  Name Primary?   Esophageal dysphagia Yes    Plan: -Schedule EGD with possible dilatation in L[facility] -Discussed dietary modifications and chin tuck maneuver  Deanna May, FNP-C [facility] 11/07/2023, 9:23 AM  Ms. May served as a Neurosurgeon for this visit.  Iva Boop, MD, [facility] 11/07/2023 12:40 PM   Cc: Kristian Covey, MD

## 2023-11-07 NOTE — Patient Instructions (Signed)
You have been scheduled for an endoscopy. Please follow written instructions given to you at your visit today.  If you use inhalers (even only as needed), please bring them with you on the day of your procedure.  If you take any of the following medications, they will need to be adjusted prior to your procedure:   DO NOT TAKE 7 DAYS PRIOR TO TEST- Trulicity (dulaglutide) Ozempic, Wegovy (semaglutide) Mounjaro (tirzepatide) Bydureon Bcise (exanatide extended release)  DO NOT TAKE 1 DAY PRIOR TO YOUR TEST Rybelsus (semaglutide) Adlyxin (lixisenatide) Victoza (liraglutide) Byetta (exanatide) ___________________________________________________________________________  _______________________________________________________  If your blood pressure at your visit was 140/90 or greater, please contact your primary care physician to follow up on this.  _______________________________________________________  If you are age 74 or older, your body mass index should be between 23-30. Your Body mass index is 22.24 kg/m. If this is out of the aforementioned range listed, please consider follow up with your Primary Care Provider.  If you are age 65 or younger, your body mass index should be between 19-25. Your Body mass index is 22.24 kg/m. If this is out of the aformentioned range listed, please consider follow up with your Primary Care Provider.   ________________________________________________________  The [place] [facility] providers would like to encourage you to use [facility] to communicate with providers for non-urgent requests or questions.  Due to long hold times on the telephone, sending your provider a message by [facility] may be a faster and more efficient way to get a response.  Please allow 48 business hours for a response.  Please remember that this is for non-urgent requests.  _______________________________________________________  I appreciate the opportunity to care for you. Stan Head, MD, [facility]

## 2023-11-08 ENCOUNTER — Encounter (HOSPITAL_BASED_OUTPATIENT_CLINIC_OR_DEPARTMENT_OTHER): Payer: Self-pay | Admitting: Family

## 2023-11-08 ENCOUNTER — Ambulatory Visit (HOSPITAL_BASED_OUTPATIENT_CLINIC_OR_DEPARTMENT_OTHER): Payer: Medicare Other | Admitting: Family

## 2023-11-08 ENCOUNTER — Other Ambulatory Visit (HOSPITAL_BASED_OUTPATIENT_CLINIC_OR_DEPARTMENT_OTHER): Payer: Self-pay

## 2023-11-08 VITALS — BP 120/62 | HR 89 | Ht 67.0 in | Wt 143.1 lb

## 2023-11-08 DIAGNOSIS — I25118 Atherosclerotic heart disease of native coronary artery with other forms of angina pectoris: Secondary | ICD-10-CM | POA: Diagnosis not present

## 2023-11-08 DIAGNOSIS — R002 Palpitations: Secondary | ICD-10-CM | POA: Diagnosis not present

## 2023-11-08 DIAGNOSIS — I2510 Atherosclerotic heart disease of native coronary artery without angina pectoris: Secondary | ICD-10-CM

## 2023-11-08 DIAGNOSIS — E785 Hyperlipidemia, unspecified: Secondary | ICD-10-CM | POA: Diagnosis not present

## 2023-11-08 DIAGNOSIS — Z87898 Personal history of other specified conditions: Secondary | ICD-10-CM | POA: Diagnosis not present

## 2023-11-08 HISTORY — DX: Atherosclerotic heart disease of native coronary artery without angina pectoris: I25.10

## 2023-11-08 MED ORDER — PROPRANOLOL HCL 20 MG PO TABS: 20.0000 mg | ORAL_TABLET | Freq: Two times a day (BID) | ORAL | 2 refills | Status: AC | PRN

## 2023-11-08 NOTE — Patient Instructions (Addendum)
Medication Instructions:  Your physician has recommended you make the following change in your medication:  START Propanolol as needed for palpitations (up to twice a day)  *If you need a refill on your cardiac medications before your next appointment, please call your pharmacy*   Lab Work: Lipids, Lpa, LFT in 4 months (one week before seeing PharmD)  If you have labs (blood work) drawn today and your tests are completely normal, you will receive your results only by: MyChart Message (if you have MyChart) OR A paper copy in the mail If you have any lab test that is abnormal or we need to change your treatment, we will call you to review the results.   Follow-Up: At [facility], you and your health needs are our priority.  As part of our continuing mission to provide you with exceptional heart care, we have created designated Provider Care Teams.  These Care Teams include your primary Cardiologist (physician) and Advanced Practice Providers (APPs -  Physician Assistants and Nurse Practitioners) who all work together to provide you with the care you need, when you need it.  We recommend signing up for the patient portal called "MyChart".  Sign up information is provided on this After Visit Summary.  MyChart is used to connect with patients for Virtual Visits (Telemedicine).  Patients are able to view lab/test results, encounter notes, upcoming appointments, etc.  Non-urgent messages can be sent to your provider as well.   To learn more about what you can do with MyChart, go to [email or web address].    Your next appointment:   PharmD in 4 months Dr. Duke Salvia or Gillian Shields in 9 months  Other Instructions  To prevent palpitations: Make sure you are adequately hydrated.  Avoid and/or limit caffeine containing beverages like soda or tea. Exercise regularly.  Manage stress well. Some over the counter medications can cause palpitations such as Benadryl, AdvilPM, TylenolPM.  Regular Advil or Tylenol do not cause palpitations.

## 2023-11-08 NOTE — Progress Notes (Signed)
Cardiology Office Note:  .   Date:  11/08/2023  ID:  CHEMERE DONEY, DOB Aug 14, 1956, MRN [id number] PCP: Kristian Covey, MD  [facility] Providers Cardiologist:  Chilton Si, MD Cardiology APP:  Alver Sorrow, NP    History of Present Illness: .   Rebecca Ray is a 67 y.o. female with history of hyperlipidemia, syncope, coronary artery disease.  Established with Dr. Duke Salvia 08/07/2023 after referral from PCP for syncope and palpitations.  Often triggered by certain foods or drinks particularly those high in sugar or caffeine.  Prior ambulatory monitor 09/2022 NSR with less than 1% PAC/PVC burden with no significant arrhythmias.  She was referred to GI for possible esophageal irritation.  Subsequent testing included echo and calcium score. Echo 08/2023 normal LVEF 60 to 65%, mild LVH, no significant valvular abnormalities.  Calcium score 09/02/2023 calcium score of 0.763 placing her in the 48th percentile with mild atherosclerosis of the aortic root.  She was recommended rosuvastatin but elected not to start due to preference to proceed with natural method.  Repeat lipid panel 11/05/23 total cholesterol 298, triglyceride 67, HDL 110, LDL 178.  Established with Dr. Leone Payor of GI yesterday it was recommended for endoscopy to evaluate and treat dysphagia.  Discussed the use of AI scribe software for clinical note transcription with the patient, who gave verbal consent to proceed.  History of Present Illness   The patient has been experiencing palpitations, often triggered by certain foods or drinks, particularly those high in sugar or caffeine. The patient reports that these episodes can last up to six hours and are often accompanied by sensation of heart racing. Reviewed unremarkable echo, cardiac monitor.The patient has declined statin therapy in the past due to concerns about side effects. However, recent labs show an LDL cholesterol level of 178, which is above the goal of  less than 70. The patient is attempting to manage her cholesterol levels through diet and exercise but acknowledges a need for more consistent exercise.       ROS: Please see the history of present illness.    All other systems reviewed and are negative.   Studies Reviewed: .        Cardiac Studies & Procedures      ECHOCARDIOGRAM  ECHOCARDIOGRAM COMPLETE 08/27/2023  Narrative ECHOCARDIOGRAM REPORT    Patient Name:   Rebecca Ray Date of Exam: 08/27/2023 Medical Rec #:  [id number]       Height:       67.0 in Accession #:    [id number]      Weight:       145.0 lb Date of Birth:  08-Jan-1956       BSA:          1.764 m Patient Age:    66 years        BP:           110/72 mmHg Patient Gender: F               HR:           76 bpm. Exam Location:  Outpatient  Procedure: 2D Echo, 3D Echo, Color Doppler, Cardiac Doppler and Strain Analysis  Indications:    R55 Syncope  History:        Patient has no prior history of Echocardiogram examinations. Signs/Symptoms:Syncope; Risk Factors:Non-Smoker and Dyslipidemia.  Sonographer:    Jeryl Columbia RDCS Referring Phys: [id number] TIFFANY [place]  IMPRESSIONS   1. Left ventricular ejection fraction,  by estimation, is 60 to 65%. The left ventricle has normal function. The left ventricle has no regional wall motion abnormalities. There is mild left ventricular hypertrophy. Left ventricular diastolic parameters were normal. 2. Right ventricular systolic function is normal. The right ventricular size is normal. Tricuspid regurgitation signal is inadequate for assessing PA pressure. 3. The mitral valve is grossly normal. No evidence of mitral valve regurgitation. 4. The aortic valve is tricuspid. Aortic valve regurgitation is not visualized. No aortic stenosis is present. 5. The inferior vena cava is normal in size with greater than 50% respiratory variability, suggesting right atrial pressure of 3 mmHg.  Conclusion(s)/Recommendation(s):  Normal biventricular function without evidence of hemodynamically significant valvular heart disease.  FINDINGS Left Ventricle: Left ventricular ejection fraction, by estimation, is 60 to 65%. The left ventricle has normal function. The left ventricle has no regional wall motion abnormalities. The left ventricular internal cavity size was normal in size. There is mild left ventricular hypertrophy. Left ventricular diastolic parameters were normal.  Right Ventricle: The right ventricular size is normal. Right ventricular systolic function is normal. Tricuspid regurgitation signal is inadequate for assessing PA pressure.  Left Atrium: Left atrial size was normal in size.  Right Atrium: Right atrial size was normal in size.  Pericardium: There is no evidence of pericardial effusion.  Mitral Valve: The mitral valve is grossly normal. No evidence of mitral valve regurgitation.  Tricuspid Valve: Tricuspid valve regurgitation is not demonstrated.  Aortic Valve: The aortic valve is tricuspid. Aortic valve regurgitation is not visualized. No aortic stenosis is present.  Pulmonic Valve: Pulmonic valve regurgitation is not visualized.  Aorta: The aortic root and ascending aorta are structurally normal, with no evidence of dilitation.  Venous: The inferior vena cava is normal in size with greater than 50% respiratory variability, suggesting right atrial pressure of 3 mmHg.  IAS/Shunts: No atrial level shunt detected by color flow Doppler.   LEFT VENTRICLE PLAX 2D LVIDd:         3.55 cm   Diastology LVIDs:         1.74 cm   LV e' medial:    7.18 cm/s LV PW:         1.18 cm   LV E/e' medial:  9.1 LV IVS:        0.95 cm   LV e' lateral:   8.16 cm/s LVOT diam:     2.00 cm   LV E/e' lateral: 8.0 LV SV:         43 LV SV Index:   25 LVOT Area:     3.14 cm  3D Volume EF: 3D EF:        67 % LV EDV:       129 ml LV ESV:       43 ml LV SV:        86 ml  RIGHT VENTRICLE RV Basal diam:  3.84  cm RV Mid diam:    2.50 cm RV S prime:     13.90 cm/s TAPSE (M-mode): 3.8 cm  LEFT ATRIUM           Index        RIGHT ATRIUM          Index LA diam:      2.20 cm 1.25 cm/m   RA Area:     9.75 cm LA Vol (A2C): 13.4 ml 7.60 ml/m   RA Volume:   21.00 ml 11.91 ml/m LA Vol (A4C): 20.6 ml 11.68  ml/m AORTIC VALVE LVOT Vmax:   77.60 cm/s LVOT Vmean:  48.700 cm/s LVOT VTI:    0.138 m  AORTA Ao Root diam: 3.70 cm Ao Asc diam:  3.30 cm  MITRAL VALVE MV Area (PHT): 3.48 cm    SHUNTS MV Decel Time: 218 msec    Systemic VTI:  0.14 m MV E velocity: 65.50 cm/s  Systemic Diam: 2.00 cm MV A velocity: 65.50 cm/s MV E/A ratio:  1.00  Photographer signed by Carolan Clines Signature Date/Time: 08/27/2023/10:04:06 AM    Final   MONITORS  LONG TERM MONITOR (3-14 DAYS) 09/19/2022  Narrative   Patient had a minimum heart rate of 57 bpm, maximum heart rate of 135 bpm, and average heart rate of 85 bpm.   Predominant underlying rhythm was sinus rhythm.   Isolated PACs were rare (<1.0%).   Isolated PVCs were rare (<1.0%).   Triggered event associated with sinus rhythm.  No malignant arrhythmias.  CT SCANS  CT CARDIAC SCORING (SELF PAY ONLY) 09/02/2023  Addendum 09/15/2023 11:58 AM ADDENDUM REPORT: 09/15/2023 11:56  EXAM: OVER-READ INTERPRETATION  CT CHEST  The following report is an over-read performed by radiologist Dr. Royal Piedra [facility] on 09/15/2023. This over-read does not include interpretation of cardiac or coronary anatomy or pathology. The coronary calcium score interpretation by the cardiologist is attached.  COMPARISON:  None available.  FINDINGS: Within the visualized portions of the thorax there are no suspicious appearing pulmonary nodules or masses, there is no acute consolidative airspace disease, no pleural effusions, no pneumothorax and no lymphadenopathy. Atherosclerotic calcifications in the thoracic aorta. Visualized  portions of the upper abdomen are unremarkable. There are no aggressive appearing lytic or blastic lesions noted in the visualized portions of the skeleton.  IMPRESSION: 1. No significant incidental noncardiac findings are noted.   Electronically Signed By: Trudie Reed M.D. On: 09/15/2023 11:56  Narrative CLINICAL DATA:  60F for cardiovascular disease risk stratification  EXAM: Coronary Calcium Score  TECHNIQUE: A gated, non-contrast computed tomography scan of the heart was performed using 3mm slice thickness. Axial images were analyzed on a dedicated workstation. Calcium scoring of the coronary arteries was performed using the Agatston method.  FINDINGS: Coronary Calcium Score:  Left main: 0.763  Left anterior descending artery: 0  Left circumflex artery: 0  Right coronary artery: 0  Total: 0.763  Percentile: 48th  Pericardium: Normal.  Mild atherosclerosis of the aortic root.  Non-cardiac: See separate report from [facility].  IMPRESSION: 1. Coronary calcium score of 0.763. This was 48th percentile for age-, race-, and sex-matched controls.  2. Mild atherosclerosis of the aortic root.  RECOMMENDATIONS: Coronary artery calcium (CAC) score is a strong predictor of incident coronary heart disease (CHD) and provides predictive information beyond traditional risk factors. CAC scoring is reasonable to use in the decision to withhold, postpone, or initiate statin therapy in intermediate-risk or selected borderline-risk asymptomatic adults (age 31-75 years and LDL-C >=70 to <190 mg/dL) who do not have diabetes or established atherosclerotic cardiovascular disease (ASCVD).* In intermediate-risk (10-year ASCVD risk >=7.5% to <20%) adults or selected borderline-risk (10-year ASCVD risk >=5% to <7.5%) adults in whom a CAC score is measured for the purpose of making a treatment decision the following recommendations have been made:  If CAC=0, it  is reasonable to withhold statin therapy and reassess in 5 to 10 years, as long as higher risk conditions are absent (diabetes mellitus, family history of premature CHD in first degree relatives (males <55  years; females <65 years), cigarette smoking, or LDL >=190 mg/dL).  If CAC is 1 to 99, it is reasonable to initiate statin therapy for patients >=57 years of age.  If CAC is >=100 or >=75th percentile, it is reasonable to initiate statin therapy at any age.  Cardiology referral should be considered for patients with CAC scores >=400 or >=75th percentile.  *2018 AHA/ACC/AACVPR/AAPA/ABC/ACPM/ADA/AGS/APhA/ASPC/NLA/PCNA Guideline on the Management of Blood Cholesterol: A Report of the American College of Cardiology/American Heart Association Task Force on Clinical Practice Guidelines. J Am Coll Cardiol. 2019;73(24):3168-3209.  Chilton Si, MD  Electronically Signed: By: Chilton Si M.D. On: 09/02/2023 10:16          Risk Assessment/Calculations:             Physical Exam:   VS:  BP 120/62   Pulse 89   Ht 5\' 7"  (1.702 m)   Wt 143 lb 1.6 oz (64.9 kg)   SpO2 94%   BMI 22.41 kg/m    Wt Readings from Last 3 Encounters:  11/08/23 143 lb 1.6 oz (64.9 kg)  11/07/23 142 lb (64.4 kg)  09/26/23 142 lb (64.4 kg)    GEN: Well nourished, well developed in no acute distress NECK: No JVD; No carotid bruits CARDIAC: RRR, no murmurs, rubs, gallops RESPIRATORY:  Clear to auscultation without rales, wheezing or rhonchi  ABDOMEN: Soft, non-tender, non-distended EXTREMITIES:  No edema; No deformity   ASSESSMENT AND PLAN: .    Assessment and Plan    Palpitations Episodes of palpitations, possibly triggered by certain foods or drinks. Prior ambulatory monitor showed NSR with less than 1% PAC/PVC burden with no significant arrhythmias. Recent episode of palpitations post-endoscopy. -Start Propranolol 20mg  PRN for palpitations. -Consider repeat ambulatory monitor for further  evaluation if symptoms persist or worsen  Hyperlipidemia / Nonobstructive CAD LDL 178, goal less than 70. Previously declined statins. Discussed the benefits of statins and other cholesterol-lowering medications. -Encourage regular cardiovascular exercise and heart-healthy diet. -Consider initiation of statin therapy. -Repeat lipid panel, LFT, Lp(a) in 4 months. -Discuss cholesterol management options with pharmacist or lipidologist at next visit.             Dispo: follow up in 4 mos with PharmD to discuss lipid and 9 months with Dr. Duke Salvia or Alver Sorrow, NP   Signed, Alver Sorrow, NP

## 2023-11-16 ENCOUNTER — Encounter: Payer: Self-pay | Admitting: Certified Registered Nurse Anesthetist

## 2023-11-18 ENCOUNTER — Encounter: Payer: Self-pay | Admitting: Internal Medicine

## 2023-11-18 ENCOUNTER — Ambulatory Visit: Payer: Medicare Other | Admitting: Internal Medicine

## 2023-11-18 VITALS — BP 122/78 | HR 83 | Temp 97.3°F | Resp 12 | Ht 67.0 in | Wt 142.0 lb

## 2023-11-18 DIAGNOSIS — K3189 Other diseases of stomach and duodenum: Secondary | ICD-10-CM | POA: Diagnosis not present

## 2023-11-18 DIAGNOSIS — K317 Polyp of stomach and duodenum: Secondary | ICD-10-CM | POA: Diagnosis not present

## 2023-11-18 DIAGNOSIS — R1310 Dysphagia, unspecified: Secondary | ICD-10-CM

## 2023-11-18 DIAGNOSIS — R1319 Other dysphagia: Secondary | ICD-10-CM

## 2023-11-18 MED ORDER — SODIUM CHLORIDE 0.9 % IV SOLN: 500.0000 mL | INTRAVENOUS | Status: DC

## 2023-11-18 NOTE — Progress Notes (Signed)
Pt's states no medical or surgical changes since previsit or office visit. 

## 2023-11-18 NOTE — Progress Notes (Signed)
Called to room to assist during endoscopic procedure.  Patient ID and intended procedure confirmed with present staff. Received instructions for my participation in the procedure from the performing physician.  

## 2023-11-18 NOTE — Op Note (Signed)
[facility] Patient Name: Rebecca Ray Procedure Date: 11/18/2023 10:26 AM MRN: [id number] Endoscopist: Iva Boop , MD, [id number] Age: 67 Referring MD:  Date of Birth: 20-Aug-1956 Gender: Female Account #: [id number] Procedure:                Upper GI endoscopy Indications:              Dysphagia Medicines:                Monitored Anesthesia Care Procedure:                Pre-Anesthesia Assessment:                           - Prior to the procedure, a History and Physical                            was performed, and patient medications and                            allergies were reviewed. The patient's tolerance of                            previous anesthesia was also reviewed. The risks                            and benefits of the procedure and the sedation                            options and risks were discussed with the patient.                            All questions were answered, and informed consent                            was obtained. Prior Anticoagulants: The patient has                            taken no anticoagulant or antiplatelet agents. ASA                            Grade Assessment: III - A patient with severe                            systemic disease. After reviewing the risks and                            benefits, the patient was deemed in satisfactory                            condition to undergo the procedure.                           After obtaining informed consent, the endoscope was  passed under direct vision. Throughout the                            procedure, the patient's blood pressure, pulse, and                            oxygen saturations were monitored continuously. The                            GIF [id number] #[id number] was introduced through the                            mouth, and advanced to the second part of duodenum.                            The upper GI endoscopy was accomplished  without                            difficulty. The patient tolerated the procedure                            well. Scope In: Scope Out: Findings:                 No endoscopic abnormality was evident in the                            esophagus to explain the patient's complaint of                            dysphagia. The scope was withdrawn. Dilation was                            performed with a Maloney dilator with mild                            resistance at 54 Fr. The dilation site was examined                            following endoscope reinsertion and showed no                            change. Estimated blood loss: none.                           Multiple diminutive sessile polyps with no stigmata                            of recent bleeding were found in the gastric body.                            Biopsies were taken with a cold forceps for  histology. Verification of patient identification                            for the specimen was done. Estimated blood loss was                            minimal.                           The exam was otherwise without abnormality.                           The cardia and gastric fundus were normal on                            retroflexion. Complications:            No immediate complications. Estimated Blood Loss:     Estimated blood loss was minimal. Impression:               - No endoscopic esophageal abnormality to explain                            patient's dysphagia. Dilated.                           - Multiple gastric polyps. Biopsied. Look like                            innocent fundic gland polyps                           - The examination was otherwise normal. Recommendation:           - Patient has a contact number available for                            emergencies. The signs and symptoms of potential                            delayed complications were discussed with the                             patient. Return to normal activities tomorrow.                            Written discharge instructions were provided to the                            patient.                           - Clear liquids x 1 hour then soft foods rest of                            day. Start prior diet tomorrow.                           -  Continue present medications.                           - Await pathology results. Iva Boop, MD 11/18/2023 10:46:33 AM This report has been signed electronically.

## 2023-11-18 NOTE — Patient Instructions (Addendum)
I did not see any strictures or blockages in the esophagus - but I dilated it to see if that helps you swallow better.  I found and biopsied some tiny gastric polyps that look innocent.  I will let you know pathology results.  I appreciate the opportunity to care for you. Iva Boop, MD, FACG  POST DILATION DIET(SEE HANDOUT) THEN TOMORROW Resume previous diet Continue present medications Await pathology results  Handouts/information given for gastric polyps and Post dilation diet  YOU HAD AN ENDOSCOPIC PROCEDURE TODAY AT THE [facility]:   Refer to the procedure report that was given to you for any specific questions about what was found during the examination.  If the procedure report does not answer your questions, please call your gastroenterologist to clarify.  If you requested that your care partner not be given the details of your procedure findings, then the procedure report has been included in a sealed envelope for you to review at your convenience later.  YOU SHOULD EXPECT: Some feelings of bloating in the abdomen. Passage of more gas than usual.  Walking can help get rid of the air that was put into your GI tract during the procedure and reduce the bloating. If you had a lower endoscopy (such as a colonoscopy or flexible sigmoidoscopy) you may notice spotting of blood in your stool or on the toilet paper. If you underwent a bowel prep for your procedure, you may not have a normal bowel movement for a few days.  Please Note:  You might notice some irritation and congestion in your nose or some drainage.  This is from the oxygen used during your procedure.  There is no need for concern and it should clear up in a day or so.  SYMPTOMS TO REPORT IMMEDIATELY:  Following upper endoscopy (EGD)  Vomiting of blood or coffee ground material  New chest pain or pain under the shoulder blades  Painful or persistently difficult swallowing  New shortness of breath  Fever of  100F or higher  Black, tarry-looking stools  For urgent or emergent issues, a gastroenterologist can be reached at any hour by calling [phone]. Do not use MyChart messaging for urgent concerns.   DIET:  SEE POST DILATION DIET HANDOUT. CLEAR LIQUIDS FOR ONE HOUR THEN SOFT DIET FOR THE REST OF TODAY. We do recommend a small meal at first, but then you may proceed to your regular diet TOMORROW!  Drink plenty of fluids but you should avoid alcoholic beverages for 24 hours.  ACTIVITY:  You should plan to take it easy for the rest of today and you should NOT DRIVE or use heavy machinery until tomorrow (because of the sedation medicines used during the test).    FOLLOW UP: Our staff will call the number listed on your records the next business day following your procedure.  We will call around 7:15- 8:00 am to check on you and address any questions or concerns that you may have regarding the information given to you following your procedure. If we do not reach you, we will leave a message.     If any biopsies were taken you will be contacted by phone or by letter within the next 1-3 weeks.  Please call us at [phone] if you have not heard about the biopsies in 3 weeks.   SIGNATURES/CONFIDENTIALITY: You and/or your care partner have signed paperwork which will be entered into your electronic medical record.  These signatures attest to the fact  that that the information above on your After Visit Summary has been reviewed and is understood.  Full responsibility of the confidentiality of this discharge information lies with you and/or your care-partner.

## 2023-11-18 NOTE — Progress Notes (Signed)
1025 Robinul 0.1 mg IV given due large amount of secretions upon assessment.  MD made aware, vss  

## 2023-11-18 NOTE — Progress Notes (Signed)
History and Physical Interval Note:  11/18/2023 10:22 AM  Rebecca Ray  has presented today for endoscopic procedure(s), with the diagnosis of  Encounter Diagnosis  Name Primary?   Esophageal dysphagia Yes  .  The various methods of evaluation and treatment have been discussed with the patient and/or family. After consideration of risks, benefits and other options for treatment, the patient has consented to  the endoscopic procedure(s).   The patient's history has been reviewed, patient examined, no change in status, stable for endoscopic procedure(s).  I have reviewed the patient's chart and labs.  Questions were answered to the patient's satisfaction.     Iva Boop, MD, Clement[facility]

## 2023-11-18 NOTE — Progress Notes (Signed)
1030 HR > 100 with esmolol 25 mg given IV, MD updated, vss

## 2023-11-18 NOTE — Progress Notes (Signed)
Report given to PACU, vss 

## 2023-11-19 ENCOUNTER — Telehealth: Payer: Self-pay

## 2023-11-19 NOTE — Telephone Encounter (Signed)
  Follow up Call-     11/18/2023    9:58 AM 01/31/2023    8:14 AM  Call back number  Post procedure Call Back phone  # [phone]  Permission to leave phone message Yes Yes     Patient questions:  Do you have a fever, pain , or abdominal swelling? No. Pain Score  0 *  Have you tolerated food without any problems? Yes.    Have you been able to return to your normal activities? Yes.    Do you have any questions about your discharge instructions: Diet   No. Medications  No. Follow up visit  No.  Do you have questions or concerns about your Care? No.  Actions: * If pain score is 4 or above: No action needed, pain <4.

## 2023-11-20 LAB — SURGICAL PATHOLOGY

## 2023-11-23 ENCOUNTER — Encounter: Payer: Self-pay | Admitting: Internal Medicine

## 2023-12-02 ENCOUNTER — Ambulatory Visit: Payer: Self-pay | Admitting: Family Medicine

## 2023-12-02 NOTE — Telephone Encounter (Signed)
  Chief Complaint: urinary symptoms Symptoms: burning with urination, increased urinary frequency.  Frequency: 3 days Pertinent Negatives: Patient denies blood in urine, fever, NVD, flank pain Disposition: [] ED /[] Urgent Care (no appt availability in office) / [x] Appointment(In office/virtual)/ []  [place] [facility]/ [] Home Care/ [] Refused Recommended Disposition /[] [place] [facility]/ []  Follow-up with PCP Additional Notes: Pt called stating that she has had burning with urination and increased frequency x 3 days. States she has 1/10 pain at this time and has tried at home remedies without relief. Pt denies blood in urine, flank pain, fever. Per protocol, pt to be evaluated within 2 weeks. Pt scheduled with PCP for 12/03/23 at 1000. Pt states she would like a call back from provider, states she does not feel that she needs an appt and would just like a UA run. Care advice reviewed, pt verbalized understanding. Denies further questions, alerting PCP for review.   Copied from CRM [id number]. Topic: Clinical - Red Word Triage >> Dec 02, 2023  8:14 AM Marica Otter wrote: Red Word that prompted transfer to Nurse Triage: Patient states she has an UTI that started Saturday, having some burning and discomfort after urination. Reason for Disposition  All other urine symptoms  Answer Assessment - Initial Assessment Questions 1. SYMPTOM: "What's the main symptom you're concerned about?" (e.g., frequency, incontinence)     Burning with urination, discomfort 2. ONSET: "When did the  symptoms  start?"     3 days ago 3. PAIN: "Is there any pain?" If Yes, ask: "How bad is it?" (Scale: 1-10; mild, moderate, severe)     1/10 4. CAUSE: "What do you think is causing the symptoms?"     UTI 5. OTHER SYMPTOMS: "Do you have any other symptoms?" (e.g., blood in urine, fever, flank pain, pain with urination)     denies  Protocols used: Urinary Symptoms-A-AH

## 2023-12-03 ENCOUNTER — Ambulatory Visit (INDEPENDENT_AMBULATORY_CARE_PROVIDER_SITE_OTHER): Payer: Medicare Other | Admitting: Family Medicine

## 2023-12-03 VITALS — BP 120/82 | HR 71 | Temp 97.8°F | Ht 67.0 in | Wt 140.0 lb

## 2023-12-03 DIAGNOSIS — R300 Dysuria: Secondary | ICD-10-CM | POA: Diagnosis not present

## 2023-12-03 LAB — POCT URINALYSIS DIPSTICK
Bilirubin, UA: NEGATIVE
Blood, UA: POSITIVE
Glucose, UA: NEGATIVE
Ketones, UA: NEGATIVE
Nitrite, UA: NEGATIVE
Protein, UA: NEGATIVE
Spec Grav, UA: 1.01 (ref 1.010–1.025)
Urobilinogen, UA: 0.2 U/dL
pH, UA: 7 (ref 5.0–8.0)

## 2023-12-03 MED ORDER — CEPHALEXIN 500 MG PO CAPS
500.0000 mg | ORAL_CAPSULE | Freq: Three times a day (TID) | ORAL | 0 refills | Status: DC
Start: 2023-12-03 — End: 2024-08-17

## 2023-12-03 NOTE — Progress Notes (Signed)
 Established Patient Office Visit  Subjective   Patient ID: Rebecca Ray, female    DOB: [birth year]  Age: 67 y.o. MRN: [id number]  Chief Complaint  Patient presents with   Dysuria    HPI   Rebecca Ray is seen today with acute urinary symptoms for past 3 days.  She relates some urine frequency, burning with urination, cloudy urine, and lower abdominal pelvic pressure.  No gross hematuria.  No flank pain.  No fever, nausea, or vomiting.  She states she has not had UTIs in at least 15 years.  No known drug allergies.  Past Medical History:  Diagnosis Date   ALLERGIC RHINITIS 10/03/2009   Allergy    CAD (coronary artery disease) 11/08/2023   History of colonic polyp - adenoma 09/07/2014   Near syncope 08/07/2023   Osteoporosis    Palpitations 08/07/2023   Seasonal allergies    Syncope    UTI (urinary tract infection)    Past Surgical History:  Procedure Laterality Date   COLONOSCOPY     TMJ ARTHROPLASTY     TONSILLECTOMY     VARICOSE VEIN SURGERY      reports that she has never smoked. She has never used smokeless tobacco. She reports current alcohol use of about 3.0 standard drinks of alcohol per week. She reports that she does not use drugs. family history includes Arthritis in an other family member; Atrial fibrillation in her father and paternal grandmother; Cirrhosis in her mother; Hyperlipidemia in an other family member; Stroke in an other family member. No Known Allergies  Review of Systems  Constitutional:  Negative for chills and fever.  Gastrointestinal:  Negative for nausea and vomiting.  Genitourinary:  Positive for dysuria and frequency. Negative for flank pain and hematuria.      Objective:     BP 120/82   Pulse 71   Temp 97.8 F (36.6 C) (Oral)   Ht 5' 7 (1.702 m)   Wt 140 lb (63.5 kg)   SpO2 98%   BMI 21.93 kg/m  BP Readings from Last 3 Encounters:  12/03/23 120/82  11/18/23 122/78  11/08/23 120/62   Wt Readings from Last 3 Encounters:   12/03/23 140 lb (63.5 kg)  11/18/23 142 lb (64.4 kg)  11/08/23 143 lb 1.6 oz (64.9 kg)      Physical Exam Vitals reviewed.  Constitutional:      General: She is not in acute distress.    Appearance: She is not ill-appearing or toxic-appearing.  Cardiovascular:     Rate and Rhythm: Normal rate and regular rhythm.  Pulmonary:     Effort: Pulmonary effort is normal.     Breath sounds: Normal breath sounds.  Neurological:     Mental Status: She is alert.      Results for orders placed or performed in visit on 12/03/23  POC Urinalysis Dipstick  Result Value Ref Range   Color, UA yellow    Clarity, UA clear    Glucose, UA Negative Negative   Bilirubin, UA neg    Ketones, UA neg    Spec Grav, UA 1.010 1.010 - 1.025   Blood, UA positive    pH, UA 7.0 5.0 - 8.0   Protein, UA Negative Negative   Urobilinogen, UA 0.2 0.2 or 1.0 E.U./dL   Nitrite, UA neg    Leukocytes, UA Small (1+) (A) Negative   Appearance     Odor        The ASCVD Risk score (Arnett DK, et  al., 2019) failed to calculate for the following reasons:   The valid HDL cholesterol range is 20 to 100 mg/dL    Assessment & Plan:   Problem List Items Addressed This Visit   None Visit Diagnoses       Dysuria    -  Primary   Relevant Orders   POC Urinalysis Dipstick (Completed)   Culture, Urine     Probable uncomplicated cystitis.  She has urine dipstick positive for leukocytes and blood.  Negative for nitrites.  Start Keflex  500 mg 3 times daily for 5 days.  Plenty fluids.  Urine culture sent.  Follow-up for any persistent or worsening symptoms.  No follow-ups on file.    Wolm Scarlet, MD

## 2023-12-04 LAB — URINE CULTURE
MICRO NUMBER:: 15905553
Result:: NO GROWTH
SPECIMEN QUALITY:: ADEQUATE

## 2024-01-14 DIAGNOSIS — Z1231 Encounter for screening mammogram for malignant neoplasm of breast: Secondary | ICD-10-CM | POA: Diagnosis not present

## 2024-03-13 ENCOUNTER — Other Ambulatory Visit: Payer: Self-pay | Admitting: Family Medicine

## 2024-04-08 ENCOUNTER — Institutional Professional Consult (permissible substitution): Admitting: Cardiology

## 2024-04-08 ENCOUNTER — Ambulatory Visit: Attending: Cardiology | Admitting: Cardiology

## 2024-04-08 ENCOUNTER — Encounter: Payer: Self-pay | Admitting: Cardiology

## 2024-04-08 ENCOUNTER — Other Ambulatory Visit: Payer: Self-pay | Admitting: Cardiology

## 2024-04-08 ENCOUNTER — Ambulatory Visit

## 2024-04-08 VITALS — BP 106/74 | HR 80 | Ht 67.0 in | Wt 139.0 lb

## 2024-04-08 DIAGNOSIS — Z87898 Personal history of other specified conditions: Secondary | ICD-10-CM

## 2024-04-08 DIAGNOSIS — R002 Palpitations: Secondary | ICD-10-CM | POA: Diagnosis not present

## 2024-04-08 NOTE — Progress Notes (Signed)
  Electrophysiology Office Note:    Date:  04/08/2024   ID:  Rebecca Ray, DOB 10/16/1956, MRN [id number]  [facility] Cardiologist:  Maudine Sos, MD  [facility] Electrophysiologist:  Boyce Byes, MD   Referring MD: Marquetta Sit, MD   Chief Complaint: Palpitations  History of Present Illness:    Rebecca Ray is a 68 year old woman being seen today for evaluation of palpitations and syncope at the request of Dr. Darren Em.  The patient describes episodes of palpitations that occur at least 1 time per week.  She also describes an episode of syncope.  She describes triggers for her arrhythmia and near syncopal episodes including certain foods or drinks.  Caffeine seems to be a pretty reliable trigger.  When she feels palpitation she describes a pounding sensation in the chest that last for hours at a time.  She has visual disturbances and feels like she may pass out.  Sometimes she feels clammy.  An ambulatory monitor in October 2023 showed rare PVCs and PACs.  There was no atrial fibrillation or sustained arrhythmias.  I also see her husband, Rebecca Ray.      Their past medical, social and family history was reviewed.   ROS:   Please see the history of present illness.    All other systems reviewed and are negative.  EKGs/Labs/Other Studies Reviewed:    The following studies were reviewed today:  Today's EKG shows sinus rhythm.  Moderate sedation.  September 2024 echo EF 60-65 RV normal No significant valvular abnormalities  September 23, 2022 ZIO monitor personally reviewed Rare PACs, PVCs No atrial fibrillation No sustained arrhythmias        Physical Exam:    VS:  BP 106/74 (BP Location: Left Arm, Patient Position: Sitting, Cuff Size: Normal)   Pulse 80   Ht 5\' 7"  (1.702 m)   Wt 139 lb (63 kg)   SpO2 97%   BMI 21.77 kg/m     Wt Readings from Last 3 Encounters:  04/08/24 139 lb (63 kg)  12/03/23 140 lb (63.5 kg)  11/18/23 142 lb  (64.4 kg)     GEN: no distress CARD: RRR, No MRG RESP: No IWOB. CTAB.        ASSESSMENT AND PLAN:    1. History of syncope   2. Palpitations     #Palpitations #History of syncope Unclear cause of her palpitations.  Could be atrial fibrillation or other SVT.  I discussed available treatment options and diagnostic strategies.  I have recommended a ZIO monitor for 2 weeks.  The ZIO monitor is unrevealing, plan for loop recorder implant at the next visit.  Continue as needed propranolol   I will plan to see her back in about 6 weeks.  If ZIO monitor is negative, plan for loop.      Signed, Rebecca Ray. Rebecca Slimmer, MD, [facility], [facility] 04/08/2024 5:30 PM    Electrophysiology [facility]

## 2024-04-08 NOTE — Progress Notes (Unsigned)
 Enrolled for [employer] to mail a ZIO XT long term holter monitor to the patients address on file.

## 2024-04-08 NOTE — Patient Instructions (Signed)
 Medication Instructions:  Your physician recommends that you continue on your current medications as directed. Please refer to the Current Medication list given to you today.  *If you need a refill on your cardiac medications before your next appointment, please call your pharmacy*  Testing/Procedures: Event Monitor Your physician has recommended that you wear an event monitor. Event monitors are medical devices that record the heart's electrical activity. Doctors most often us  these monitors to diagnose arrhythmias. Arrhythmias are problems with the speed or rhythm of the heartbeat. The monitor is a small, portable device. You can wear one while you do your normal daily activities. This is usually used to diagnose what is causing palpitations/syncope (passing out).  Follow-Up: At [facility], you and your health needs are our priority.  As part of our continuing mission to provide you with exceptional heart care, our providers are all part of one team.  This team includes your primary Cardiologist (physician) and Advanced Practice Providers or APPs (Physician Assistants and Nurse Practitioners) who all work together to provide you with the care you need, when you need it.  Your next appointment:   6 weeks   Provider:   Harvie Liner, MD

## 2024-05-01 DIAGNOSIS — Z87898 Personal history of other specified conditions: Secondary | ICD-10-CM | POA: Diagnosis not present

## 2024-05-01 DIAGNOSIS — R002 Palpitations: Secondary | ICD-10-CM | POA: Diagnosis not present

## 2024-05-04 ENCOUNTER — Ambulatory Visit: Payer: Self-pay | Admitting: Cardiology

## 2024-05-04 DIAGNOSIS — Z87898 Personal history of other specified conditions: Secondary | ICD-10-CM | POA: Diagnosis not present

## 2024-05-04 DIAGNOSIS — R002 Palpitations: Secondary | ICD-10-CM | POA: Diagnosis not present

## 2024-05-05 ENCOUNTER — Ambulatory Visit: Admitting: Cardiology

## 2024-05-06 ENCOUNTER — Encounter: Payer: Self-pay | Admitting: Cardiology

## 2024-05-06 ENCOUNTER — Telehealth: Payer: Self-pay | Admitting: Radiology

## 2024-05-06 ENCOUNTER — Ambulatory Visit: Attending: Cardiology | Admitting: Cardiology

## 2024-05-06 VITALS — BP 110/62 | HR 76 | Ht 67.0 in | Wt 142.2 lb

## 2024-05-06 DIAGNOSIS — I480 Paroxysmal atrial fibrillation: Secondary | ICD-10-CM | POA: Diagnosis not present

## 2024-05-06 MED ORDER — ASPIRIN 81 MG PO TBEC: 81.0000 mg | DELAYED_RELEASE_TABLET | Freq: Every day | ORAL | Status: DC

## 2024-05-06 NOTE — Patient Instructions (Signed)
 Medication Instructions:  Your physician has recommended you make the following change in your medication:  1) START taking aspirin 81 mg daily  *If you need a refill on your cardiac medications before your next appointment, please call your pharmacy*  Testing/Procedures: [place] Sleep Study  Your physician has recommended that you have a sleep study. This test records several body functions during sleep, including: brain activity, eye movement, oxygen and carbon dioxide blood levels, heart rate and rhythm, breathing rate and rhythm, the flow of air through your mouth and nose, snoring, body muscle movements, and chest and belly movement.   Follow-Up: At [facility], you and your health needs are our priority.  As part of our continuing mission to provide you with exceptional heart care, our providers are all part of one team.  This team includes your primary Cardiologist (physician) and Advanced Practice Providers or APPs (Physician Assistants and Nurse Practitioners) who all work together to provide you with the care you need, when you need it.  Your next appointment:   6 months  Provider:   You will see one of the following Advanced Practice Providers on your designated Care Team:   Mertha Abrahams, Kennard Pea [address]" [place], PA-C Suzann Riddle, NP Creighton Doffing, NP

## 2024-05-06 NOTE — Telephone Encounter (Signed)
 Patient agreement reviewed and signed on 05/06/2024.  WatchPAT issued to patient on 05/06/2024 by Marybeth Smock. Patient aware to not open the WatchPAT box until contacted with the activation PIN. Patient profile initialized in CloudPAT on 05/06/2024 by Jenise Mixer. Device serial number: [id number]  Please list Reason for Call as Advice Only and type "WatchPAT issued to patient" in the comment box.

## 2024-05-06 NOTE — Progress Notes (Signed)
  Electrophysiology Office Follow up Visit Note:    Date:  05/06/2024   ID:  Rebecca Ray, DOB 1956-07-26, MRN [id number]  PCP:  Marquetta Sit, MD  [facility] Cardiologist:  Maudine Sos, MD  [facility] Electrophysiologist:  Boyce Byes, MD    Interval History:     Rebecca Ray is a 68 y.o. female who presents for a follow up visit.   I last saw the patient Apr 08, 2024 for palpitations and syncope.  The palpitations occur at least 1 time per week.  At the last appointment we ordered a 2-week ZIO monitor.  She was taking as needed propranolol .  The ZIO monitor returned and demonstrated atrial fibrillation.  She is with her husband today in clinic. She snores at night. Felt 1 episode of palpitations while wearing monitor. She tells me that multiple family members have AF.        Past medical, surgical, social and family history were reviewed.  ROS:   Please see the history of present illness.    All other systems reviewed and are negative.  EKGs/Labs/Other Studies Reviewed:    The following studies were reviewed today:  May 04, 2024 ZIO monitor reviewed and shows atrial fibrillation/flutter with average rate 130 bpm.        Physical Exam:    VS:  BP 110/62   Pulse 76   Ht 5\' 7"  (1.702 m)   Wt 142 lb 3.2 oz (64.5 kg)   SpO2 96%   BMI 22.27 kg/m     Wt Readings from Last 3 Encounters:  05/06/24 142 lb 3.2 oz (64.5 kg)  04/08/24 139 lb (63 kg)  12/03/23 140 lb (63.5 kg)     GEN: no distress CARD: RRR, No MRG RESP: No IWOB. CTAB.      ASSESSMENT:    1. Paroxysmal atrial fibrillation (HCC)    PLAN:    In order of problems listed above:  #Atrial fibrillation New diagnosis  CHA2DS2-VASc Score = 2  The patient's score is based upon: CHF History: 0 HTN History: 0 Diabetes History: 0 Stroke History: 0 Vascular Disease History: 0 Age Score: 1 Gender Score: 1  She will start aspirin 81mg  PO daily. I discussed the  ablation in detail during today's visit.; I also discussed flecainide, multtaq, amio and tikosyn. She will observe her symptoms for a while and let us  know if she wants to proceed with any particular therapy.   Discussed treatment options today for AF including antiarrhythmic drug therapy and ablation. Discussed risks, recovery and likelihood of success with each treatment strategy. Risk, benefits, and alternatives to EP study and ablation for afib were discussed. These risks include but are not limited to stroke, bleeding, vascular damage, tamponade, perforation, damage to the esophagus, lungs, phrenic nerve and other structures, pulmonary vein stenosis, worsening renal function, coronary vasospasm and death.  Discussed potential need for repeat ablation procedures and antiarrhythmic drugs after an initial ablation. The patient understands these risks.   #Possible Sleep Anpea Discussed OSA and link to AF treatment success today. Plan for outpatient sleep study.   Follow up 6 months w APP.  Signed, Harvie Liner, MD, [facility], [facility] 05/06/2024 2:17 PM    Electrophysiology [facility]

## 2024-05-11 ENCOUNTER — Encounter: Payer: Self-pay | Admitting: Family Medicine

## 2024-05-11 ENCOUNTER — Ambulatory Visit (INDEPENDENT_AMBULATORY_CARE_PROVIDER_SITE_OTHER): Admitting: Family Medicine

## 2024-05-11 VITALS — BP 114/70 | HR 79 | Temp 98.3°F | Wt 143.0 lb

## 2024-05-11 DIAGNOSIS — L255 Unspecified contact dermatitis due to plants, except food: Secondary | ICD-10-CM | POA: Diagnosis not present

## 2024-05-11 MED ORDER — TRIAMCINOLONE ACETONIDE 0.5 % EX CREA: 1.0000 | TOPICAL_CREAM | Freq: Two times a day (BID) | CUTANEOUS | 1 refills | Status: AC | PRN

## 2024-05-11 NOTE — Progress Notes (Signed)
 Established Patient Office Visit  Subjective   Patient ID: Rebecca Ray, female    DOB: Mar 05, 1956  Age: 68 y.o. MRN: [id number]  Chief Complaint  Patient presents with   [facility]    Patient complains of poison ivy, x1.5 weeks     HPI   Rebecca Ray is seen with poison ivy rash for the past week and a half.  This was noted after working outside.  She has had contact dermatitis previously.  She has tried calamine and some type of other anti-itch topical without much improvement.  No fever.  Recently diagnosed with atrial fibrillation.  She is considering possible ablation if this is an option.  Currently taking aspirin .  No anticoagulants.  Was informed by her cardiologist to try to avoid steroids if possible  Past Medical History:  Diagnosis Date   ALLERGIC RHINITIS 10/03/2009   Allergy    CAD (coronary artery disease) 11/08/2023   History of colonic polyp - adenoma 09/07/2014   Near syncope 08/07/2023   Osteoporosis    Palpitations 08/07/2023   Seasonal allergies    Syncope    UTI (urinary tract infection)    Past Surgical History:  Procedure Laterality Date   COLONOSCOPY     TMJ ARTHROPLASTY     TONSILLECTOMY     VARICOSE VEIN SURGERY      reports that she has never smoked. She has never used smokeless tobacco. She reports current alcohol use of about 3.0 standard drinks of alcohol per week. She reports that she does not use drugs. family history includes Arthritis in an other family member; Atrial fibrillation in her father and paternal grandmother; Cirrhosis in her mother; Hyperlipidemia in an other family member; Stroke in an other family member. No Known Allergies  Review of Systems  Constitutional:  Negative for chills and fever.  Skin:  Positive for itching and rash.      Objective:     BP 114/70 (BP Location: Left Arm, Patient Position: Sitting, Cuff Size: Normal)   Pulse 79   Temp 98.3 F (36.8 C) (Oral)   Wt 143 lb (64.9 kg)   SpO2 95%   BMI 22.40  kg/m  BP Readings from Last 3 Encounters:  05/11/24 114/70  05/06/24 110/62  04/08/24 106/74   Wt Readings from Last 3 Encounters:  05/11/24 143 lb (64.9 kg)  05/06/24 142 lb 3.2 oz (64.5 kg)  04/08/24 139 lb (63 kg)      Physical Exam Vitals reviewed.  Constitutional:      General: She is not in acute distress.    Appearance: She is not ill-appearing.  Cardiovascular:     Rate and Rhythm: Normal rate and regular rhythm.  Pulmonary:     Effort: Pulmonary effort is normal.     Breath sounds: Normal breath sounds.  Skin:    Findings: Rash present.     Comments: She has some scattered rash on her inner arms bilaterally with some involvement of the forearms but mostly arms.  Raised vesicular lesions consistent with contact dermatitis.  She has some calamine applied at this time.  Neurological:     Mental Status: She is alert.      No results found for any visits on 05/11/24.    The ASCVD Risk score (Arnett DK, et al., 2019) failed to calculate for the following reasons:   The valid HDL cholesterol range is 20 to 100 mg/dL    Assessment & Plan:   Contact dermatitis rash.  Would  prefer to avoid steroids with her A-fib history if possible.  We discussed topical triamcinolone  0.5% cream to use twice daily as needed.  Watch closely for any signs of secondary infection.  Otherwise keep clean with soap and water daily.  Glean Lamy, MD

## 2024-05-20 ENCOUNTER — Ambulatory Visit: Admitting: Cardiology

## 2024-05-25 ENCOUNTER — Encounter: Payer: Self-pay | Admitting: Cardiology

## 2024-05-25 ENCOUNTER — Telehealth: Payer: Self-pay

## 2024-05-25 NOTE — Telephone Encounter (Signed)
**Note De-Identified Rebecca Ray Obfuscation** Ordering provider: Dr Cindie Associated diagnoses: A-fib-I48.0  WatchPAT PA obtained on 05/25/2024 by Rebecca Ray, Rebecca HERO, LPN. Authorization: Per the [facility] Medicare website: [facility] does not require prior authorization for CPT Code: 04199  Patient notified of PIN (1234) on 05/25/2024 Rebecca Ray Notification Method: MyChart message.  Phone note routed to covering staff for follow-up.  Instructions for covering staff:  Please contact patient in 2 weeks if WatchPAT study results are not available yet. Remind patient to complete test.  If patient declines to proceed with test, please confirm that box is unopened and remind patient to return it to the office within 30 days. Route phone note to [facility] SLEEP STUDIES pool for tracking.  If box has been opened, please route phone note to [facility] SLEEP STUDIES pool to have device de-initialized and processed for billing.

## 2024-05-25 NOTE — Telephone Encounter (Signed)
**Note De-Identified Andrika Peraza Obfuscation** Ordering provider: Dr Cindie Associated diagnoses: A-fib-I48.0  WatchPAT PA obtained on 05/25/2024 by Leslie Jester, Avelina HERO, LPN. Authorization: Per the [facility] Medicare website: [facility] does not require prior authorization for CPT Code: 04199  Patient notified of PIN (1234) on 05/25/2024 Moody Robben Notification Method: MyChart message.  Phone note routed to covering staff for follow-up.  Instructions for covering staff:  Please contact patient in 2 weeks if WatchPAT study results are not available yet. Remind patient to complete test.  If patient declines to proceed with test, please confirm that box is unopened and remind patient to return it to the office within 30 days. Route phone note to CV DIV SLEEP STUDIES pool for tracking.  If box has been opened, please route phone note to [facility] DIV SLEEP STUDIES pool to have device de-initialized and processed for billing.

## 2024-05-31 ENCOUNTER — Encounter: Payer: Self-pay | Admitting: Cardiology

## 2024-05-31 ENCOUNTER — Encounter (INDEPENDENT_AMBULATORY_CARE_PROVIDER_SITE_OTHER): Admitting: Cardiology

## 2024-05-31 DIAGNOSIS — G4733 Obstructive sleep apnea (adult) (pediatric): Secondary | ICD-10-CM

## 2024-06-14 NOTE — Procedures (Signed)
   SLEEP STUDY REPORT Patient Information Study Date: 05/31/2024 Patient Name: Rebecca Ray Patient ID: [id number] Birth Date: December 12, 1955 Age: 68 Gender: Female BMI: 22.1 (W=141 lb, H=5' 7'') Stopbang: 3 Referring Physician: Ole Holts, MD  TEST DESCRIPTION: Home sleep apnea testing was completed using the WatchPat, a Type 1 device, utilizing peripheral arterial tonometry (PAT), chest movement, actigraphy, pulse oximetry, pulse rate, body position and snore. AHI was calculated with apnea and hypopnea using valid sleep time as the denominator. RDI includes apneas, hypopneas, and RERAs. The data acquired and the scoring of sleep and all associated events were performed in accordance with the recommended standards and specifications as outlined in the AASM Manual for the Scoring of Sleep and Associated Events 2.2.0 (2015). ,  FINDINGS:  1. Moderate Obstructive Sleep Apnea with AHI 15.9/hr.  2. No Central Sleep Apnea with pAHIc 1.2/hr.  3. Oxygen desaturations as low as 84%.  4. Moderate snoring was present. O2 sats were < 88% for 1.2 min.  5. Total sleep time was 7 hrs and 15 min.  6. 17.6% of total sleep time was spent in REM sleep.  7. Normal sleep onset latency at 18 min  8. Shortened REM sleep onset latency at 57 min.  9. Total awakenings were 9. 10. Arrhythmia detection: None  DIAGNOSIS: Moderate Obstructive Sleep Apnea (G47.33)  RECOMMENDATIONS: 1. Clinical correlation of these findings is necessary. The decision to treat obstructive sleep apnea (OSA) is usually based on the presence of apnea symptoms or the presence of associated medical conditions such as Hypertension, Congestive Heart Failure, Atrial Fibrillation or Obesity. The most common symptoms of OSA are snoring, gasping for breath while sleeping, daytime sleepiness and fatigue. 2. Initiating apnea therapy is recommended given the presence of symptoms and/or associated conditions. Recommend proceeding with  one of the following:  a. Auto-CPAP therapy with a pressure range of 5-20cm H2O.  b. An oral appliance (OA) that can be obtained from certain dentists with expertise in sleep medicine. These are primarily of use in non-obese patients with mild and moderate disease.  c. An ENT consultation which may be useful to look for specific causes of obstruction and possible treatment options.  d. If patient is intolerant to PAP therapy, consider referral to ENT for evaluation for hypoglossal nerve stimulator. 3. Close follow-up is necessary to ensure success with CPAP or oral appliance therapy for maximum benefit . 4. A follow-up oximetry study on CPAP is recommended to assess the adequacy of therapy and determine the need for supplemental oxygen or the potential need for Bi-level therapy. An arterial blood gas to determine the adequacy of baseline ventilation and oxygenation should also be considered. 5. Healthy sleep recommendations include: adequate nightly sleep (normal 7-9 hrs/night), avoidance of caffeine after noon and alcohol near bedtime, and maintaining a sleep environment that is cool, dark and quiet. 6. Weight loss for overweight patients is recommended. Even modest amounts of weight loss can significantly improve the severity of sleep apnea. 7. Snoring recommendations include: weight loss where appropriate, side sleeping, and avoidance of alcohol before bed. 8. Operation of motor vehicle should not be performed when sleepy.  Signature: Wilbert Bihari, MD; [facility]; Diplomat, American Board of Sleep Medicine Electronically Signed: 06/14/2024 3:28:06 PM

## 2024-06-15 ENCOUNTER — Ambulatory Visit: Attending: Cardiology

## 2024-06-15 DIAGNOSIS — I480 Paroxysmal atrial fibrillation: Secondary | ICD-10-CM

## 2024-06-17 ENCOUNTER — Telehealth: Payer: Self-pay

## 2024-06-17 NOTE — Telephone Encounter (Signed)
-----   Message from Wilbert Bihari sent at 06/14/2024  3:40 PM EDT ----- Please let patient know that they have sleep apnea.  Recommend therapeutic CPAP titration for treatment of patient's sleep disordered breathing.

## 2024-06-17 NOTE — Telephone Encounter (Signed)
 Notified patient of sleep study results and recommendations. Patient stated she did not sleep well that night. Messages sent to provider about taking the test again. Notified patient the sleep coordinator will return patient's call with provider recommendations.

## 2024-06-22 NOTE — Progress Notes (Signed)
 This encounter was created in error - please disregard.

## 2024-06-22 NOTE — Procedures (Signed)
 Erroneous encounter

## 2024-07-03 ENCOUNTER — Telehealth: Payer: Self-pay

## 2024-07-03 ENCOUNTER — Other Ambulatory Visit: Payer: Self-pay | Admitting: Family Medicine

## 2024-07-03 DIAGNOSIS — I25118 Atherosclerotic heart disease of native coronary artery with other forms of angina pectoris: Secondary | ICD-10-CM

## 2024-07-03 DIAGNOSIS — G4733 Obstructive sleep apnea (adult) (pediatric): Secondary | ICD-10-CM

## 2024-07-03 NOTE — Telephone Encounter (Signed)
-----   Message from Rebecca Ray sent at 06/14/2024  3:40 PM EDT ----- Please let patient know that they have sleep apnea.  Recommend therapeutic CPAP titration for treatment of patient's sleep disordered breathing.

## 2024-07-03 NOTE — Telephone Encounter (Signed)
 Left VM with callback number for patient to receive provider recommendations.

## 2024-07-27 ENCOUNTER — Telehealth: Payer: Self-pay | Admitting: Cardiology

## 2024-07-27 NOTE — Telephone Encounter (Signed)
 Pt called in and would like to get Ablation Setup   Best number [phone]

## 2024-08-04 DIAGNOSIS — I480 Paroxysmal atrial fibrillation: Secondary | ICD-10-CM

## 2024-08-04 NOTE — Telephone Encounter (Signed)
 Patient called again to follow-up on getting scheduled for an ablation.  Patient wants advice on next steps.

## 2024-08-04 NOTE — Telephone Encounter (Signed)
 Spoke with pt and advised will forward to Dr Cindie and his nurse for next steps as pt has concerns sleep study was not accurate since she did not sleep well the night of study due to eating late.  See that note for complete details. (06/17/2024)

## 2024-08-05 ENCOUNTER — Other Ambulatory Visit: Payer: Self-pay

## 2024-08-05 DIAGNOSIS — I480 Paroxysmal atrial fibrillation: Secondary | ICD-10-CM

## 2024-08-10 NOTE — Telephone Encounter (Signed)
The patient has been notified of the result. Left detailed message on voicemail and informed patient to call back.

## 2024-08-10 NOTE — Telephone Encounter (Signed)
 The patient has been notified of the result and verbalized understanding.  All questions (if any) were answered. Joshua Dalton Seip, CMA 08/10/2024 2:01 PM    Patient has denied the titration stating Dr Cindie ordered the sleep study to rule out osa because she is sheduled an ablation on Oct 13. Patient states it was a bad night to test because she had ice cream and cup cakes around 8 pm that night and she did not sleep well. She does not think she has osa and states there is no history of osa in her family.

## 2024-08-13 ENCOUNTER — Other Ambulatory Visit (HOSPITAL_COMMUNITY): Payer: Self-pay

## 2024-08-13 MED ORDER — APIXABAN 5 MG PO TABS
5.0000 mg | ORAL_TABLET | Freq: Two times a day (BID) | ORAL | 3 refills | Status: DC
  Filled 2024-08-13: qty 60, 30d supply, fill #0

## 2024-08-17 ENCOUNTER — Telehealth (HOSPITAL_COMMUNITY): Payer: Self-pay

## 2024-08-17 NOTE — Telephone Encounter (Signed)
 Spoke with patient to complete pre-procedure call.     Health status review:  Any new medical conditions, recent signs of acute illness or been started on antibiotics? No Any recent hospitalizations or surgeries? No Any new medications started since pre-op visit? Started Eliquis  5 mg twice daily on today and aware to stop ASA.   Follow all medication instructions prior to procedure or the procedure may be rescheduled:    Continue taking Eliquis  (Apixaban ) twice daily without missing any doses before procedure. Essential chronic medications:  No medication should be continued, unless told otherwise. On the morning of your procedure DO NOT take any medication., including Eliquis  (Apixaban ).  Nothing to eat or drink after midnight prior to your procedure.  Pre-procedure testing scheduled: lab work completed today, September 15.  Confirmed patient is scheduled for Atrial Fibrillation Ablation on Monday, October 13 with Dr. Ole Holts. Instructed patient to arrive at the Main Entrance A at [facility]: [address] [place], [place] [address] and check in at Admitting at 8:00 AM.  Advised of plan to go home the same day and will only stay overnight if medically necessary. You MUST have a responsible adult to drive you home and MUST be with you the first 24 hours after you arrive home or your procedure could be cancelled.  Informed patient a nurse will call a day before the procedure to confirm arrival time and ensure instructions are followed.  Patient verbalized understanding to information provided and is agreeable to proceed with procedure.   Advised patient to contact RN Navigator at [phone], to inform of any new medications started after call or concerns prior to procedure.

## 2024-08-19 ENCOUNTER — Telehealth: Payer: Self-pay

## 2024-08-19 NOTE — Telephone Encounter (Signed)
Work up complete. 

## 2024-08-19 NOTE — Telephone Encounter (Signed)
-----   Message from Nurse Carlyle C sent at 08/06/2024  1:02 PM EDT ----- Regarding: 10/13 afib ablation Important: list procedure date as first item in subject line, followed by procedure type (e.g., 08/14/24 AFib ablation)  Precert:  MD: Cindie Type of ablation: A-fib Diagnosis: A-fib CPT code: A-fib (06343) Ablation scheduled (date/time): 10/13 at 10:00am  Procedure:  Added to calendar? Yes Orders entered? Yes Letter complete? Yes Scheduled with cath lab? Yes Any medications to hold? No, patient will need to start on Eliquis  5 mg on 9/15 - stop ASA at this time Labs ordered (CBC, BMET, PT/INR if on warfarin): Yes Mapping system: Doesn't matter CARTO/OPAL rep notified? No Cardiac CT needed? No Dye allergy? No Pre-meds ordered and instructions given? Yes Letter method: MyChart H&P: 6/4 Device: No  Follow-up:  Cassie/Angel, please schedule Routine.  Covering RN - please send this message to [employer], EP scheduler, EP Scheduling pool, EP [employer], and CT scheduler ([place] Lynch/Stephanie Mogg), if indicated.

## 2024-08-20 LAB — BASIC METABOLIC PANEL WITH GFR
BUN/Creatinine Ratio: 19 (ref 12–28)
BUN: 15 mg/dL (ref 8–27)
CO2: 20 mmol/L (ref 20–29)
Calcium: 10.6 mg/dL — AB (ref 8.7–10.3)
Chloride: 109 mmol/L — AB (ref 96–106)
Creatinine, Ser: 0.79 mg/dL (ref 0.57–1.00)
Glucose: 86 mg/dL (ref 70–99)
Potassium: 4.9 mmol/L (ref 3.5–5.2)
eGFR: 82 mL/min/1.73 (ref >59)

## 2024-08-20 LAB — CBC
Hematocrit: 42.1 % (ref 34.0–46.6)
Hemoglobin: 13.7 g/dL (ref 11.1–15.9)
MCH: 31.3 pg (ref 26.6–33.0)
MCHC: 32.5 g/dL (ref 31.5–35.7)
MCV: 96 fL (ref 79–97)
Platelets: 262 x10E3/uL (ref 150–450)
RBC: 4.38 x10E6/uL (ref 3.77–5.28)
RDW: 12.6 % (ref 11.7–15.4)
WBC: 3.9 x10E3/uL (ref 3.4–10.8)

## 2024-08-31 NOTE — Addendum Note (Signed)
 Addended by: JOSHUA DALTON MATSU on: 08/31/2024 01:43 PM   Modules accepted: Orders

## 2024-08-31 NOTE — Telephone Encounter (Addendum)
 Order placed for [employer].  Prior Authorization/Notification is not required for the requested service(s).  Decision ID #: [id number]

## 2024-09-13 NOTE — Pre-Procedure Instructions (Signed)
 Instructed patient on the following items: Arrival time 0530, new arrival time Nothing to eat or drink after midnight No meds AM of procedure Responsible person to drive you home and stay with you for 24 hrs  Have you missed any doses of anti-coagulant Eliquis - takes twice a day, hasn't missed any doses.  Don't take dose morning of procedure.

## 2024-09-14 ENCOUNTER — Ambulatory Visit (HOSPITAL_COMMUNITY)
Admission: RE | Admit: 2024-09-14 | Discharge: 2024-09-14 | Disposition: A | Discharge status: Home / Self Care | Attending: Cardiology | Admitting: Cardiology

## 2024-09-14 ENCOUNTER — Ambulatory Visit (HOSPITAL_COMMUNITY)
Admission: RE | Disposition: A | Discharge status: Home / Self Care | Payer: Self-pay | Source: Home / Self Care | Attending: Cardiology

## 2024-09-14 ENCOUNTER — Ambulatory Visit (HOSPITAL_COMMUNITY): Admitting: Anesthesiology

## 2024-09-14 ENCOUNTER — Other Ambulatory Visit: Payer: Self-pay

## 2024-09-14 ENCOUNTER — Other Ambulatory Visit (HOSPITAL_COMMUNITY): Payer: Self-pay

## 2024-09-14 ENCOUNTER — Telehealth: Payer: Self-pay | Admitting: Cardiology

## 2024-09-14 DIAGNOSIS — Z79899 Other long term (current) drug therapy: Secondary | ICD-10-CM | POA: Insufficient documentation

## 2024-09-14 DIAGNOSIS — Z8249 Family history of ischemic heart disease and other diseases of the circulatory system: Secondary | ICD-10-CM | POA: Diagnosis not present

## 2024-09-14 DIAGNOSIS — I480 Paroxysmal atrial fibrillation: Secondary | ICD-10-CM

## 2024-09-14 DIAGNOSIS — I4891 Unspecified atrial fibrillation: Secondary | ICD-10-CM

## 2024-09-14 DIAGNOSIS — Z7982 Long term (current) use of aspirin: Secondary | ICD-10-CM | POA: Diagnosis not present

## 2024-09-14 DIAGNOSIS — I2510 Atherosclerotic heart disease of native coronary artery without angina pectoris: Secondary | ICD-10-CM | POA: Diagnosis not present

## 2024-09-14 DIAGNOSIS — E785 Hyperlipidemia, unspecified: Secondary | ICD-10-CM

## 2024-09-14 HISTORY — PX: ATRIAL FIBRILLATION ABLATION: EP1191

## 2024-09-14 LAB — POCT ACTIVATED CLOTTING TIME: Activated Clotting Time: 291 s

## 2024-09-14 SURGERY — ATRIAL FIBRILLATION ABLATION
Anesthesia: General

## 2024-09-14 MED ORDER — ATROPINE SULFATE 1 MG/10ML IJ SOSY
PREFILLED_SYRINGE | INTRAMUSCULAR | Status: AC
  Filled 2024-09-14: qty 10

## 2024-09-14 MED ORDER — DEXAMETHASONE SODIUM PHOSPHATE 4 MG/ML IJ SOLN
INTRAMUSCULAR | Status: DC | PRN
Start: 2024-09-14 — End: 2024-09-14
  Administered 2024-09-14: 8 mg via INTRAVENOUS

## 2024-09-14 MED ORDER — ACETAMINOPHEN 325 MG PO TABS: 650.0000 mg | ORAL_TABLET | ORAL | Status: DC | PRN

## 2024-09-14 MED ORDER — HEPARIN SODIUM (PORCINE) 1000 UNIT/ML IJ SOLN
INTRAMUSCULAR | Status: DC | PRN
  Administered 2024-09-14: 10000 [IU] via INTRAVENOUS
  Administered 2024-09-14: 4000 [IU] via INTRAVENOUS

## 2024-09-14 MED ORDER — SODIUM CHLORIDE 0.9 % IV SOLN: INTRAVENOUS | Status: DC

## 2024-09-14 MED ORDER — PANTOPRAZOLE SODIUM 40 MG PO TBEC
40.0000 mg | DELAYED_RELEASE_TABLET | Freq: Every day | ORAL | 0 refills | Status: AC
  Filled 2024-09-14: qty 45, 45d supply, fill #0

## 2024-09-14 MED ORDER — EPHEDRINE SULFATE-NACL 50-0.9 MG/10ML-% IV SOSY
PREFILLED_SYRINGE | INTRAVENOUS | Status: DC | PRN
  Administered 2024-09-14: 5 mg via INTRAVENOUS

## 2024-09-14 MED ORDER — PHENYLEPHRINE 80 MCG/ML (10ML) SYRINGE FOR IV PUSH (FOR BLOOD PRESSURE SUPPORT)
PREFILLED_SYRINGE | INTRAVENOUS | Status: DC | PRN
  Administered 2024-09-14: 120 ug via INTRAVENOUS
  Administered 2024-09-14: 160 ug via INTRAVENOUS
  Administered 2024-09-14: 80 ug via INTRAVENOUS

## 2024-09-14 MED ORDER — COLCHICINE 0.6 MG PO TABS
0.6000 mg | ORAL_TABLET | Freq: Two times a day (BID) | ORAL | 0 refills | Status: DC
  Filled 2024-09-14: qty 10, 5d supply, fill #0

## 2024-09-14 MED ORDER — FENTANYL CITRATE (PF) 100 MCG/2ML IJ SOLN
INTRAMUSCULAR | Status: AC
  Filled 2024-09-14: qty 2

## 2024-09-14 MED ORDER — PHENYLEPHRINE HCL-NACL 20-0.9 MG/250ML-% IV SOLN
INTRAVENOUS | Status: DC | PRN
  Administered 2024-09-14: 20 ug/min via INTRAVENOUS

## 2024-09-14 MED ORDER — SODIUM CHLORIDE 0.9% FLUSH: 3.0000 mL | INTRAVENOUS | Status: DC | PRN

## 2024-09-14 MED ORDER — PROPOFOL 10 MG/ML IV BOLUS
INTRAVENOUS | Status: DC | PRN
  Administered 2024-09-14: 140 mg via INTRAVENOUS

## 2024-09-14 MED ORDER — LIDOCAINE 2% (20 MG/ML) 5 ML SYRINGE
INTRAMUSCULAR | Status: DC | PRN
  Administered 2024-09-14: 60 mg via INTRAVENOUS

## 2024-09-14 MED ORDER — ONDANSETRON HCL 4 MG/2ML IJ SOLN: 4.0000 mg | Freq: Four times a day (QID) | INTRAMUSCULAR | Status: DC | PRN

## 2024-09-14 MED ORDER — APIXABAN 5 MG PO TABS
5.0000 mg | ORAL_TABLET | Freq: Two times a day (BID) | ORAL | Status: DC
  Administered 2024-09-14: 5 mg via ORAL
  Filled 2024-09-14: qty 1

## 2024-09-14 MED ORDER — ROCURONIUM BROMIDE 10 MG/ML (PF) SYRINGE
PREFILLED_SYRINGE | INTRAVENOUS | Status: DC | PRN
  Administered 2024-09-14: 60 mg via INTRAVENOUS

## 2024-09-14 MED ORDER — HEPARIN (PORCINE) IN NACL 1000-0.9 UT/500ML-% IV SOLN
INTRAVENOUS | Status: DC | PRN
  Administered 2024-09-14 (×3): 500 mL

## 2024-09-14 MED ORDER — PANTOPRAZOLE SODIUM 40 MG PO TBEC
40.0000 mg | DELAYED_RELEASE_TABLET | Freq: Every day | ORAL | Status: DC
  Administered 2024-09-14: 40 mg via ORAL
  Filled 2024-09-14: qty 1

## 2024-09-14 MED ORDER — ONDANSETRON HCL 4 MG/2ML IJ SOLN
INTRAMUSCULAR | Status: DC | PRN
  Administered 2024-09-14: 4 mg via INTRAVENOUS

## 2024-09-14 MED ORDER — SODIUM CHLORIDE 0.9% FLUSH
3.0000 mL | Freq: Two times a day (BID) | INTRAVENOUS | Status: DC
Start: 2024-09-14 — End: 2024-09-14

## 2024-09-14 MED ORDER — PROTAMINE SULFATE 10 MG/ML IV SOLN
INTRAVENOUS | Status: DC | PRN
  Administered 2024-09-14: 35 mg via INTRAVENOUS

## 2024-09-14 MED ORDER — SUGAMMADEX SODIUM 200 MG/2ML IV SOLN
INTRAVENOUS | Status: DC | PRN
  Administered 2024-09-14: 200 mg via INTRAVENOUS

## 2024-09-14 MED ORDER — COLCHICINE 0.6 MG PO TABS
0.6000 mg | ORAL_TABLET | Freq: Two times a day (BID) | ORAL | Status: DC
  Administered 2024-09-14: 0.6 mg via ORAL
  Filled 2024-09-14: qty 1

## 2024-09-14 MED ORDER — FENTANYL CITRATE (PF) 250 MCG/5ML IJ SOLN
INTRAMUSCULAR | Status: DC | PRN
  Administered 2024-09-14 (×2): 50 ug via INTRAVENOUS

## 2024-09-14 MED ORDER — SODIUM CHLORIDE 0.9 % IV SOLN: 250.0000 mL | INTRAVENOUS | Status: DC | PRN

## 2024-09-14 SURGICAL SUPPLY — 19 items
BLANKET WARM UNDERBOD FULL ACC (MISCELLANEOUS) ×1 IMPLANT
CABLE FARASTAR GEN2 SNGL USE (CABLE) IMPLANT
CATH FARAWAVE 2.0 31 (CATHETERS) IMPLANT
CATH GE 8FR SOUNDSTAR (CATHETERS) IMPLANT
CATH OCTARAY 2.0 F 3-3-3-3-3 (CATHETERS) IMPLANT
CATH WEBSTER BI DIR CS D-F CRV (CATHETERS) IMPLANT
CLOSURE PERCLOSE PROSTYLE (Vascular Products) IMPLANT
COVER SWIFTLINK CONNECTOR (BAG) ×1 IMPLANT
DILATOR VESSEL 38 20CM 16FR (INTRODUCER) IMPLANT
GUIDEWIRE INQWIRE 1.5J.035X260 (WIRE) IMPLANT
KIT VERSACROSS CNCT FARADRIVE (KITS) IMPLANT
PACK EP LF (CUSTOM PROCEDURE TRAY) ×1 IMPLANT
PAD DEFIB RADIO PHYSIO CONN (PAD) ×1 IMPLANT
PATCH CARTO3 (PAD) IMPLANT
SHEATH AVANTI 11CM 9FR (SHEATH) IMPLANT
SHEATH FARADRIVE STEERABLE (SHEATH) IMPLANT
SHEATH PINNACLE 8F 10CM (SHEATH) IMPLANT
SHEATH PINNACLE 9F 10CM (SHEATH) IMPLANT
SHEATH PROBE COVER 6X72 (BAG) IMPLANT

## 2024-09-14 NOTE — Anesthesia Procedure Notes (Signed)
 Procedure Name: Intubation Date/Time: 09/14/2024 8:05 AM  Performed by: Tressie Gilmore RAMAN, CRNAPre-anesthesia Checklist: Patient identified, Emergency Drugs available, Suction available and Patient being monitored Patient Re-evaluated:Patient Re-evaluated prior to induction Oxygen Delivery Method: Circle System Utilized Preoxygenation: Pre-oxygenation with 100% oxygen Induction Type: IV induction Ventilation: Mask ventilation without difficulty Laryngoscope Size: Miller, 2, Mac and 3 Grade View: Grade III Tube type: Oral Tube size: 7.5 mm Number of attempts: 3 Airway Equipment and Method: Stylet and Oral airway Placement Confirmation: ETT inserted through vocal cords under direct vision, positive ETCO2 and breath sounds checked- equal and bilateral Secured at: 20 cm Tube secured with: Tape Dental Injury: Teeth and Oropharynx as per pre-operative assessment and Injury to lip  Difficulty Due To: Difficulty was unanticipated, Difficult Airway- due to anterior larynx, Difficult Airway- due to limited oral opening and Difficult Airway- due to reduced neck mobility Comments: CRNA DL X2 once with Mil 2 then Mac 3, minimal view anterior with redundant Epiglottis tissue reducing view.  MDA DL x 1 with Mac three remained difficult but ETT passed with no oral pharynx or dental trauma. Minor lip slit jelly applied. Pt had dry chapped lips prior.

## 2024-09-14 NOTE — Discharge Instructions (Signed)
 Cardiac Ablation, Care After  This sheet gives you information about how to care for yourself after your procedure. Your health care provider may also give you more specific instructions. If you have problems or questions, contact your health care provider. What can I expect after the procedure? After the procedure, it is common to have: Bruising around your puncture site. Tenderness around your puncture site. Skipped heartbeats. If you had an atrial fibrillation ablation, you may have atrial fibrillation during the first several months after your procedure.  Tiredness (fatigue).  Follow these instructions at home: Puncture site care  Follow instructions from your health care provider about how to take care of your puncture site. Make sure you: If present, leave stitches (sutures), skin glue, or adhesive strips in place. These skin closures may need to stay in place for up to 2 weeks. If adhesive strip edges start to loosen and curl up, you may trim the loose edges. Do not remove adhesive strips completely unless your health care provider tells you to do that. If a large square bandage is present, this may be removed 24 hours after surgery.  Check your puncture site every day for signs of infection. Check for: Redness, swelling, or pain. Fluid or blood. If your puncture site starts to bleed, lie down on your back, apply firm pressure to the area, and contact your health care provider. Warmth. Pus or a bad smell. A pea or marble sized lump/knot at the site is normal and can take up to three months to resolve.  Driving Do not drive for at least 4 days after your procedure or however long your health care provider recommends. (Do not resume driving if you have previously been instructed not to drive for other health reasons.) Do not drive or use heavy machinery while taking prescription pain medicine. Activity Avoid activities that take a lot of effort for at least 7 days after your  procedure. Do not lift anything that is heavier than 5 lb (4.5 kg) for one week.  No sexual activity for 1 week.  Return to your normal activities as told by your health care provider. Ask your health care provider what activities are safe for you. General instructions Take over-the-counter and prescription medicines only as told by your health care provider. Do not use any products that contain nicotine or tobacco, such as cigarettes and e-cigarettes. If you need help quitting, ask your health care provider. You may shower after 24 hours, but Do not take baths, swim, or use a hot tub for 1 week.  Do not drink alcohol for 24 hours after your procedure. Keep all follow-up visits as told by your health care provider. This is important. Contact a health care provider if: You have redness, mild swelling, or pain around your puncture site. You have fluid or blood coming from your puncture site that stops after applying firm pressure to the area. Your puncture site feels warm to the touch. You have pus or a bad smell coming from your puncture site. You have a fever. You have chest pain or discomfort that spreads to your neck, jaw, or arm. You have chest pain that is worse with lying on your back or taking a deep breath. You are sweating a lot. You feel nauseous. You have a fast or irregular heartbeat. You have shortness of breath. You are dizzy or light-headed and feel the need to lie down. You have pain or numbness in the arm or leg closest to your puncture site.  Get help right away if: Your puncture site suddenly swells. Your puncture site is bleeding and the bleeding does not stop after applying firm pressure to the area. These symptoms may represent a serious problem that is an emergency. Do not wait to see if the symptoms will go away. Get medical help right away. Call your local emergency services (911 in the [place] Do not drive yourself to the hospital. Summary After the procedure, it  is normal to have bruising and tenderness at the puncture site in your groin, neck, or forearm. Check your puncture site every day for signs of infection. Get help right away if your puncture site is bleeding and the bleeding does not stop after applying firm pressure to the area. This is a medical emergency. This information is not intended to replace advice given to you by your health care provider. Make sure you discuss any questions you have with your health care provider. Femoral Site Care This sheet gives you information about how to care for yourself after your procedure. Your health care provider may also give you more specific instructions. If you have problems or questions, contact your health care provider. What can I expect after the procedure?  After the procedure, it is common to have: Bruising that usually fades within 1-2 weeks. Tenderness at the site. Follow these instructions at home: Wound care Follow instructions from your health care provider about how to take care of your insertion site. Make sure you: Wash your hands with soap and water before you change your bandage (dressing). If soap and water are not available, use hand sanitizer. Remove your dressing as told by your health care provider. 24 hours Do not take baths, swim, or use a hot tub until your health care provider approves. You may shower 24-48 hours after the procedure or as told by your health care provider. Gently wash the site with plain soap and water. Pat the area dry with a clean towel. Do not rub the site. This may cause bleeding. Do not apply powder or lotion to the site. Keep the site clean and dry. Check your femoral site every day for signs of infection. Check for: Redness, swelling, or pain. Fluid or blood. Warmth. Pus or a bad smell. Activity For the first 2-3 days after your procedure, or as long as directed: Avoid climbing stairs as much as possible. Do not squat. Do not lift anything  that is heavier than 10 lb (4.5 kg), or the limit that you are told, until your health care provider says that it is safe. For 5 days Rest as directed. Avoid sitting for a long time without moving. Get up to take short walks every 1-2 hours. Do not drive for 24 hours if you were given a medicine to help you relax (sedative). General instructions Take over-the-counter and prescription medicines only as told by your health care provider. Keep all follow-up visits as told by your health care provider. This is important. Contact a health care provider if you have: A fever or chills. You have redness, swelling, or pain around your insertion site. Get help right away if: The catheter insertion area swells very fast. You pass out. You suddenly start to sweat or your skin gets clammy. The catheter insertion area is bleeding, and the bleeding does not stop when you hold steady pressure on the area. The area near or just beyond the catheter insertion site becomes pale, cool, tingly, or numb. These symptoms may represent a serious problem that  is an emergency. Do not wait to see if the symptoms will go away. Get medical help right away. Call your local emergency services (911 in the [place] Do not drive yourself to the hospital. Summary After the procedure, it is common to have bruising that usually fades within 1-2 weeks. Check your femoral site every day for signs of infection. Do not lift anything that is heavier than 10 lb (4.5 kg), or the limit that you are told, until your health care provider says that it is safe. This information is not intended to replace advice given to you by your health care provider. Make sure you discuss any questions you have with your health care provider. Document Revised: 12/02/2017 Document Reviewed: 12/02/2017 Elsevier Patient Education  2020 [employer].

## 2024-09-14 NOTE — Transfer of Care (Signed)
 Immediate Anesthesia Transfer of Care Note  Patient: Rebecca Ray  Procedure(s) Performed: ATRIAL FIBRILLATION ABLATION  Patient Location: PACU  Anesthesia Type:General  Level of Consciousness: awake, alert , and oriented  Airway & Oxygen Therapy: Patient Spontanous Breathing and Patient connected to nasal cannula oxygen  Post-op Assessment: Report given to RN and Post -op Vital signs reviewed and stable  Post vital signs: Reviewed and stable  Last Vitals:  Vitals Value Taken Time  BP 115/66 09/14/24 09:20  Temp 36.9 C 09/14/24 09:13  Pulse 91 09/14/24 09:21  Resp 12 09/14/24 09:21  SpO2 97 % 09/14/24 09:21  Vitals shown include unfiled device data.  Last Pain:  Vitals:   09/14/24 0913  TempSrc: Oral  PainSc: 0-No pain         Complications: There were no known notable events for this encounter.

## 2024-09-14 NOTE — Anesthesia Preprocedure Evaluation (Addendum)
 Anesthesia Evaluation  Patient identified by MRN, date of birth, ID band Patient awake    Reviewed: Allergy & Precautions, NPO status , Patient's Chart, lab work & pertinent test results  Airway Mallampati: II  TM Distance: >3 FB Neck ROM: Full    Dental no notable dental hx.    Pulmonary neg pulmonary ROS   Pulmonary exam normal        Cardiovascular + CAD   Rhythm:Irregular Rate:Normal     Neuro/Psych negative neurological ROS  negative psych ROS   GI/Hepatic negative GI ROS, Neg liver ROS,,,  Endo/Other  negative endocrine ROS    Renal/GU negative Renal ROS  negative genitourinary   Musculoskeletal  (+) Arthritis , Osteoarthritis,    Abdominal Normal abdominal exam  (+)   Peds  Hematology Lab Results      Component                Value               Date                      WBC                      3.9                 08/17/2024                HGB                      13.7                08/17/2024                HCT                      42.1                08/17/2024                MCV                      96                  08/17/2024                PLT                      262                 08/17/2024             Lab Results      Component                Value               Date                      NA                       CANCELED            08/17/2024                K  4.9                 08/17/2024                CO2                      20                  08/17/2024                GLUCOSE                  86                  08/17/2024                BUN                      15                  08/17/2024                CREATININE               0.79                08/17/2024                CALCIUM                  10.6 (H)            08/17/2024                GFR                      90.12               07/30/2023                EGFR                     82                   08/17/2024              Anesthesia Other Findings   Reproductive/Obstetrics                              Anesthesia Physical Anesthesia Plan  ASA: 3  Anesthesia Plan: General   Post-op Pain Management:    Induction: Intravenous  PONV Risk Score and Plan: 3 and Ondansetron, Dexamethasone, Treatment may vary due to age or medical condition and Midazolam  Airway Management Planned: Mask and Oral ETT  Additional Equipment: None  Intra-op Plan:   Post-operative Plan: Extubation in OR  Informed Consent: I have reviewed the patients History and Physical, chart, labs and discussed the procedure including the risks, benefits and alternatives for the proposed anesthesia with the patient or authorized representative who has indicated his/her understanding and acceptance.     Dental advisory given  Plan Discussed with: CRNA  Anesthesia Plan Comments:         Anesthesia Quick Evaluation

## 2024-09-14 NOTE — Telephone Encounter (Signed)
*  STAT* If patient is at the pharmacy, call can be transferred to refill team.   1. Which medications need to be refilled? (please list name of each medication and dose if known) apixaban  (ELIQUIS ) tablet 5 mg    2. Would you like to learn more about the convenience, safety, & potential cost savings by using the [facility]? No   3. Are you open to using the [facility] (Type [facility].  ). No    4. Which pharmacy/location (including street and city if local pharmacy) is medication to be sent to?[id number] - [place], [place] - [address]    5. Do they need a 30 day or 90 day supply? 90 day    Pt is out of Medication

## 2024-09-14 NOTE — Progress Notes (Signed)
 Patient ambulated to the bathroom and voided. No hematoma or bleeding noted to bilateral groin sites. Dressing to bilateral groin sites clean, dry, and intact.

## 2024-09-14 NOTE — H&P (Signed)
  Electrophysiology Office Follow up Visit Note:     Date:  09/14/2024    ID:  Rebecca Ray, DOB November 07, 1956, MRN [id number]   PCP:  Micheal Wolm ORN, MD           [facility] Cardiologist:  Annabella Scarce, MD  [facility] Electrophysiologist:  OLE ONEIDA HOLTS, MD      Interval History:       Rebecca Ray is a 68 y.o. female who presents for a follow up visit.    I last saw the patient Apr 08, 2024 for palpitations and syncope.  The palpitations occur at least 1 time per week.  At the last appointment we ordered a 2-week ZIO monitor.  She was taking as needed propranolol .   The ZIO monitor returned and demonstrated atrial fibrillation.   She is with her husband today in clinic. She snores at night. Felt 1 episode of palpitations while wearing monitor. She tells me that multiple family members have AF.  Presents for AF ablation today. Procedure reviewed.     Objective Past medical, surgical, social and family history were reviewed.   ROS:   Please see the history of present illness.    All other systems reviewed and are negative.   EKGs/Labs/Other Studies Reviewed:     The following studies were reviewed today:   May 04, 2024 ZIO monitor reviewed and shows atrial fibrillation/flutter with average rate 130 bpm.           Physical Exam:     VS:  BP 131/79   Pulse 79   Ht 5' 7 (1.702 m)   Wt 142 lb 3.2 oz (64.5 kg)   SpO2 96%   BMI 22.27 kg/m         Wt Readings from Last 3 Encounters:  05/06/24 142 lb 3.2 oz (64.5 kg)  04/08/24 139 lb (63 kg)  12/03/23 140 lb (63.5 kg)      GEN: no distress CARD: RRR, No MRG RESP: No IWOB. CTAB.     Assessment ASSESSMENT:     1. Paroxysmal atrial fibrillation (HCC)     PLAN:     In order of problems listed above:   #Atrial fibrillation New diagnosis   CHA2DS2-VASc Score = 2  The patient's score is based upon: CHF History: 0 HTN History: 0 Diabetes History: 0 Stroke History: 0 Vascular  Disease History: 0 Age Score: 1 Gender Score: 1   She will start aspirin  81mg  PO daily. I discussed the ablation in detail during today's visit.; I also discussed flecainide, multtaq, amio and tikosyn. She will observe her symptoms for a while and let us  know if she wants to proceed with any particular therapy.    Discussed treatment options today for AF including antiarrhythmic drug therapy and ablation. Discussed risks, recovery and likelihood of success with each treatment strategy. Risk, benefits, and alternatives to EP study and ablation for afib were discussed. These risks include but are not limited to stroke, bleeding, vascular damage, tamponade, perforation, damage to the esophagus, lungs, phrenic nerve and other structures, pulmonary vein stenosis, worsening renal function, coronary vasospasm and death.  Discussed potential need for repeat ablation procedures and antiarrhythmic drugs after an initial ablation. The patient understands these risks.     Presents for AF ablation. Procedure reviewed.  Signed, OLE HOLTS, MD, [facility], [facility] 09/14/2024 Electrophysiology [facility]

## 2024-09-15 ENCOUNTER — Telehealth (HOSPITAL_COMMUNITY): Payer: Self-pay

## 2024-09-15 ENCOUNTER — Encounter (HOSPITAL_COMMUNITY): Payer: Self-pay | Admitting: Cardiology

## 2024-09-15 MED ORDER — APIXABAN 5 MG PO TABS: 5.0000 mg | ORAL_TABLET | Freq: Two times a day (BID) | ORAL | 3 refills | Status: DC

## 2024-09-15 MED FILL — Fentanyl Citrate Preservative Free (PF) Inj 100 MCG/2ML: INTRAMUSCULAR | Qty: 2 | Status: AC

## 2024-09-15 NOTE — Telephone Encounter (Signed)
 Spoke with patient to complete post procedure follow up call.  Patient reports no complications with groin sites.   Instructions reviewed with patient:  Remove large bandage at puncture site after 24 hours. It is normal to have bruising, tenderness, mild swelling, and a pea or marble sized lump/knot at the groin site which can take up to three months to resolve.  Get help right away if you notice sudden swelling at the puncture site.  Check your puncture site every day for signs of infection: fever, redness, swelling, pus drainage, warmth, foul odor or excessive pain. If this occurs, please call [phone], to speak with the RN Navigator. Get help right away if your puncture site is bleeding and the bleeding does not stop after applying firm pressure to the area.  You may continue to have skipped beats/ atrial fibrillation during the first several months after your procedure.  It is very important not to miss any doses of your blood thinner Eliquis .    You will follow up with the Afib clinic 4 weeks after your procedure and follow up with the APP 3 months after your procedure.   Patient verbalized understanding to all instructions provided.

## 2024-09-15 NOTE — Anesthesia Postprocedure Evaluation (Signed)
 Anesthesia Post Note  Patient: Rebecca Ray  Procedure(s) Performed: ATRIAL FIBRILLATION ABLATION     Patient location during evaluation: PACU Anesthesia Type: General Level of consciousness: awake and alert Pain management: pain level controlled Vital Signs Assessment: post-procedure vital signs reviewed and stable Respiratory status: spontaneous breathing, nonlabored ventilation, respiratory function stable and patient connected to nasal cannula oxygen Cardiovascular status: blood pressure returned to baseline and stable Postop Assessment: no apparent nausea or vomiting Anesthetic complications: no   There were no known notable events for this encounter.  Last Vitals:  Vitals:   09/14/24 1130 09/14/24 1200  BP: (!) 108/55 (!) 109/59  Pulse: 78 84  Resp: 10 17  Temp:    SpO2: 97% 97%    Last Pain:  Vitals:   09/14/24 1023  TempSrc:   PainSc: 0-No pain                 Cordella P Eppie Barhorst

## 2024-09-27 ENCOUNTER — Other Ambulatory Visit: Payer: Self-pay | Admitting: Family Medicine

## 2024-10-12 ENCOUNTER — Ambulatory Visit (HOSPITAL_COMMUNITY)
Admission: RE | Admit: 2024-10-12 | Discharge: 2024-10-12 | Disposition: A | Discharge status: Home / Self Care | Source: Ambulatory Visit | Attending: Physician Assistant | Admitting: Physician Assistant

## 2024-10-12 VITALS — BP 116/72 | HR 75 | Ht 67.0 in | Wt 144.8 lb

## 2024-10-12 DIAGNOSIS — I4891 Unspecified atrial fibrillation: Secondary | ICD-10-CM

## 2024-10-12 DIAGNOSIS — G4733 Obstructive sleep apnea (adult) (pediatric): Secondary | ICD-10-CM | POA: Diagnosis not present

## 2024-10-12 DIAGNOSIS — I480 Paroxysmal atrial fibrillation: Secondary | ICD-10-CM

## 2024-10-12 NOTE — Progress Notes (Signed)
 Primary Care Physician: Micheal Wolm ORN, MD Primary Cardiologist: Annabella Scarce, MD Electrophysiologist: OLE ONEIDA HOLTS, MD  Referring Physician: Dr Lambert   Rebecca Ray is a 68 y.o. female with a history of OSA, HLD, atrial fibrillation who presents for follow up in the [facility].  The patient was initially diagnosed with atrial fibrillation 05/2024 on a cardiac monitor. She was seen by Dr Holts and underwent afib ablation on 09/14/24. Patient is on Eliquis  for stroke prevention.    Patient presents today for follow up for atrial fibrillation. She is in SR today and feels well. She denies any interim symptoms of afib. No bleeding issues on anticoagulation. She denies any chest pain or groin issues.   Today, she denies symptoms of palpitations, chest pain, shortness of breath, orthopnea, PND, lower extremity edema, dizziness, presyncope, syncope, daytime somnolence, bleeding, or neurologic sequela. The patient is tolerating medications without difficulties and is otherwise without complaint today.    Atrial Fibrillation Risk Factors:  she does have symptoms or diagnosis of sleep apnea. she does not have a history of rheumatic fever. The patient does have a history of early familial atrial fibrillation or other arrhythmias.  Atrial Fibrillation Management history:  Previous antiarrhythmic drugs: none Previous cardioversions: none Previous ablations: 09/14/24 Anticoagulation history: Eliquis   ROS- All systems are reviewed and negative except as per the HPI above.  Past Medical History:  Diagnosis Date   ALLERGIC RHINITIS 10/03/2009   Allergy    CAD (coronary artery disease) 11/08/2023   History of colonic polyp - adenoma 09/07/2014   Near syncope 08/07/2023   Osteoporosis    Palpitations 08/07/2023   Seasonal allergies    Syncope    UTI (urinary tract infection)     Current Outpatient Medications  Medication Sig Dispense  Refill   acetaminophen (TYLENOL) 650 MG CR tablet Take 650 mg by mouth every 8 (eight) hours as needed for pain (hand Pain). (Patient taking differently: Take 650 mg by mouth as needed for pain (hand Pain).)     alendronate  (FOSAMAX ) 70 MG tablet TAKE 1 TABLET(70 MG) BY MOUTH EVERY 7 DAYS WITH A FULL GLASS OF WATER AND ON AN EMPTY STOMACH 12 tablet 0   apixaban  (ELIQUIS ) 5 MG TABS tablet Take 1 tablet (5 mg total) by mouth 2 (two) times daily. 180 tablet 3   cetirizine (ZYRTEC) 10 MG tablet Take 10 mg by mouth daily.     ibuprofen (ADVIL) 200 MG tablet Take 400 mg by mouth daily in the afternoon.     Multiple Vitamin (MULTIVITAMIN WITH MINERALS) TABS tablet Take by mouth daily. 15 ml     OVER THE COUNTER MEDICATION Take 1 tablet by mouth daily. I care / Chew     OVER THE COUNTER MEDICATION Take 4 capsules by mouth daily. nutrafol for women     OVER THE COUNTER MEDICATION Take 1 tablet by mouth daily with supper. cholesterol Support     pantoprazole (PROTONIX) 40 MG tablet Take 1 tablet (40 mg total) by mouth daily. No refills necessary, post procedure medication. 45 tablet 0   propranolol  (INDERAL ) 20 MG tablet Take 1 tablet (20 mg total) by mouth 2 (two) times daily as needed (as needed for palpitations). 30 tablet 2   triamcinolone  cream (KENALOG ) 0.5 % Apply 1 Application topically 2 (two) times daily as needed. 30 g 1   No current facility-administered medications for this encounter.    Physical Exam: BP 116/72   Pulse  75   Ht 5' 7 (1.702 m)   Wt 65.7 kg   BMI 22.68 kg/m   GEN: Well nourished, well developed in no acute distress CARDIAC: Regular rate and rhythm, no murmurs, rubs, gallops RESPIRATORY:  Clear to auscultation without rales, wheezing or rhonchi  ABDOMEN: Soft, non-tender, non-distended EXTREMITIES:  No edema; No deformity   Wt Readings from Last 3 Encounters:  10/12/24 65.7 kg  09/14/24 63.5 kg  05/11/24 64.9 kg     EKG today demonstrates  SR Vent. rate 75  BPM PR interval 174 ms QRS duration 80 ms QT/QTcB 384/428 ms   Echo 08/27/23 demonstrated   1. Left ventricular ejection fraction, by estimation, is 60 to 65%. The  left ventricle has normal function. The left ventricle has no regional  wall motion abnormalities. There is mild left ventricular hypertrophy.  Left ventricular diastolic parameters were normal.   2. Right ventricular systolic function is normal. The right ventricular  size is normal. Tricuspid regurgitation signal is inadequate for assessing  PA pressure.   3. The mitral valve is grossly normal. No evidence of mitral valve  regurgitation.   4. The aortic valve is tricuspid. Aortic valve regurgitation is not  visualized. No aortic stenosis is present.   5. The inferior vena cava is normal in size with greater than 50%  respiratory variability, suggesting right atrial pressure of 3 mmHg.    CHA2DS2-VASc Score = 2  The patient's score is based upon: CHF History: 0 HTN History: 0 Diabetes History: 0 Stroke History: 0 Vascular Disease History: 0 Age Score: 1 Gender Score: 1       ASSESSMENT AND PLAN: Paroxysmal Atrial Fibrillation (ICD10:  I48.0) The patient's CHA2DS2-VASc score is 2, indicating a 2.2% annual risk of stroke.   S/p afib ablation 09/14/24 Patient appears to be maintaining SR Continue Eliquis  5 mg BID with no missed doses for 3 months post ablation.  Continue propranolol  20 mg BID PRN for heart racing.   OSA  Sleep study 05/31/24 moderate OSA She questions the results of that study stating that she did not sleep well that night. She would like a second opinion/repeat sleep study. Will refer.    Follow up with Charlies Arthur as scheduled.    [facility] [address] [place], [place] [address] [phone]

## 2024-12-14 ENCOUNTER — Ambulatory Visit: Payer: Self-pay | Admitting: Physician Assistant

## 2024-12-15 MED ORDER — APIXABAN 5 MG PO TABS: 5.0000 mg | ORAL_TABLET | Freq: Two times a day (BID) | ORAL | 3 refills | Status: AC

## 2024-12-20 ENCOUNTER — Other Ambulatory Visit: Payer: Self-pay | Admitting: Family Medicine

## 2025-01-12 ENCOUNTER — Ambulatory Visit: Payer: Self-pay | Admitting: Pulmonary Disease
